# Patient Record
Sex: Female | Born: 1935 | Race: White | Hispanic: No | Smoking: Never smoker
Health system: Southern US, Community
[De-identification: ages and names within clinical notes are randomized; demographics above are authoritative.]

## PROBLEM LIST (undated history)

## (undated) DIAGNOSIS — I10 Essential (primary) hypertension: Secondary | ICD-10-CM

## (undated) DIAGNOSIS — R42 Dizziness and giddiness: Secondary | ICD-10-CM

## (undated) HISTORY — PX: ABDOMINAL HYSTERECTOMY: SHX81

## (undated) HISTORY — PX: TONSILLECTOMY: SUR1361

---

## 1998-03-25 ENCOUNTER — Other Ambulatory Visit: Admission: RE | Admit: 1998-03-25 | Discharge: 1998-03-25 | Payer: Self-pay | Admitting: Internal Medicine

## 2001-04-21 ENCOUNTER — Ambulatory Visit (HOSPITAL_COMMUNITY): Admission: RE | Admit: 2001-04-21 | Discharge: 2001-04-21 | Payer: Self-pay | Admitting: Gastroenterology

## 2003-05-30 ENCOUNTER — Emergency Department (HOSPITAL_COMMUNITY): Admission: EM | Admit: 2003-05-30 | Discharge: 2003-05-30 | Payer: Self-pay | Admitting: *Deleted

## 2003-06-03 ENCOUNTER — Ambulatory Visit (HOSPITAL_COMMUNITY): Admission: RE | Admit: 2003-06-03 | Discharge: 2003-06-03 | Payer: Self-pay | Admitting: General Surgery

## 2003-08-17 ENCOUNTER — Inpatient Hospital Stay (HOSPITAL_COMMUNITY): Admission: EM | Admit: 2003-08-17 | Discharge: 2003-08-21 | Payer: Self-pay | Admitting: Emergency Medicine

## 2011-10-10 DIAGNOSIS — Z803 Family history of malignant neoplasm of breast: Secondary | ICD-10-CM | POA: Diagnosis not present

## 2011-10-10 DIAGNOSIS — Z1231 Encounter for screening mammogram for malignant neoplasm of breast: Secondary | ICD-10-CM | POA: Diagnosis not present

## 2011-11-15 DIAGNOSIS — M25579 Pain in unspecified ankle and joints of unspecified foot: Secondary | ICD-10-CM | POA: Diagnosis not present

## 2011-11-15 DIAGNOSIS — M79609 Pain in unspecified limb: Secondary | ICD-10-CM | POA: Diagnosis not present

## 2011-11-15 DIAGNOSIS — M948X9 Other specified disorders of cartilage, unspecified sites: Secondary | ICD-10-CM | POA: Diagnosis not present

## 2011-11-15 DIAGNOSIS — M2010 Hallux valgus (acquired), unspecified foot: Secondary | ICD-10-CM | POA: Diagnosis not present

## 2011-11-23 DIAGNOSIS — M25579 Pain in unspecified ankle and joints of unspecified foot: Secondary | ICD-10-CM | POA: Diagnosis not present

## 2011-11-23 DIAGNOSIS — M65979 Unspecified synovitis and tenosynovitis, unspecified ankle and foot: Secondary | ICD-10-CM | POA: Diagnosis not present

## 2011-11-23 DIAGNOSIS — M659 Synovitis and tenosynovitis, unspecified: Secondary | ICD-10-CM | POA: Diagnosis not present

## 2011-11-28 DIAGNOSIS — N183 Chronic kidney disease, stage 3 unspecified: Secondary | ICD-10-CM | POA: Diagnosis not present

## 2011-11-28 DIAGNOSIS — E785 Hyperlipidemia, unspecified: Secondary | ICD-10-CM | POA: Diagnosis not present

## 2011-11-28 DIAGNOSIS — I10 Essential (primary) hypertension: Secondary | ICD-10-CM | POA: Diagnosis not present

## 2011-11-28 DIAGNOSIS — I872 Venous insufficiency (chronic) (peripheral): Secondary | ICD-10-CM | POA: Diagnosis not present

## 2011-11-28 DIAGNOSIS — M316 Other giant cell arteritis: Secondary | ICD-10-CM | POA: Diagnosis not present

## 2011-11-28 DIAGNOSIS — E168 Other specified disorders of pancreatic internal secretion: Secondary | ICD-10-CM | POA: Diagnosis not present

## 2011-12-05 DIAGNOSIS — I10 Essential (primary) hypertension: Secondary | ICD-10-CM | POA: Diagnosis not present

## 2011-12-05 DIAGNOSIS — L03019 Cellulitis of unspecified finger: Secondary | ICD-10-CM | POA: Diagnosis not present

## 2011-12-05 DIAGNOSIS — L02519 Cutaneous abscess of unspecified hand: Secondary | ICD-10-CM | POA: Diagnosis not present

## 2011-12-06 DIAGNOSIS — I10 Essential (primary) hypertension: Secondary | ICD-10-CM | POA: Diagnosis not present

## 2011-12-06 DIAGNOSIS — E785 Hyperlipidemia, unspecified: Secondary | ICD-10-CM | POA: Diagnosis not present

## 2011-12-06 DIAGNOSIS — L03019 Cellulitis of unspecified finger: Secondary | ICD-10-CM | POA: Diagnosis not present

## 2011-12-06 DIAGNOSIS — L02519 Cutaneous abscess of unspecified hand: Secondary | ICD-10-CM | POA: Diagnosis not present

## 2011-12-06 DIAGNOSIS — M109 Gout, unspecified: Secondary | ICD-10-CM | POA: Diagnosis not present

## 2011-12-07 DIAGNOSIS — M2020 Hallux rigidus, unspecified foot: Secondary | ICD-10-CM | POA: Diagnosis not present

## 2011-12-07 DIAGNOSIS — M7150 Other bursitis, not elsewhere classified, unspecified site: Secondary | ICD-10-CM | POA: Diagnosis not present

## 2011-12-11 DIAGNOSIS — M109 Gout, unspecified: Secondary | ICD-10-CM | POA: Diagnosis not present

## 2012-01-18 DIAGNOSIS — M7150 Other bursitis, not elsewhere classified, unspecified site: Secondary | ICD-10-CM | POA: Diagnosis not present

## 2012-01-18 DIAGNOSIS — M2020 Hallux rigidus, unspecified foot: Secondary | ICD-10-CM | POA: Diagnosis not present

## 2012-05-26 DIAGNOSIS — E168 Other specified disorders of pancreatic internal secretion: Secondary | ICD-10-CM | POA: Diagnosis not present

## 2012-05-26 DIAGNOSIS — M316 Other giant cell arteritis: Secondary | ICD-10-CM | POA: Diagnosis not present

## 2012-05-26 DIAGNOSIS — M109 Gout, unspecified: Secondary | ICD-10-CM | POA: Diagnosis not present

## 2012-05-26 DIAGNOSIS — Z1331 Encounter for screening for depression: Secondary | ICD-10-CM | POA: Diagnosis not present

## 2012-06-02 DIAGNOSIS — Z23 Encounter for immunization: Secondary | ICD-10-CM | POA: Diagnosis not present

## 2012-09-16 DIAGNOSIS — H0230 Blepharochalasis unspecified eye, unspecified eyelid: Secondary | ICD-10-CM | POA: Diagnosis not present

## 2012-09-16 DIAGNOSIS — Z961 Presence of intraocular lens: Secondary | ICD-10-CM | POA: Diagnosis not present

## 2012-09-16 DIAGNOSIS — H35369 Drusen (degenerative) of macula, unspecified eye: Secondary | ICD-10-CM | POA: Diagnosis not present

## 2012-11-14 DIAGNOSIS — E168 Other specified disorders of pancreatic internal secretion: Secondary | ICD-10-CM | POA: Diagnosis not present

## 2012-11-14 DIAGNOSIS — J0100 Acute maxillary sinusitis, unspecified: Secondary | ICD-10-CM | POA: Diagnosis not present

## 2012-11-14 DIAGNOSIS — H6690 Otitis media, unspecified, unspecified ear: Secondary | ICD-10-CM | POA: Diagnosis not present

## 2012-11-14 DIAGNOSIS — R059 Cough, unspecified: Secondary | ICD-10-CM | POA: Diagnosis not present

## 2012-11-14 DIAGNOSIS — R05 Cough: Secondary | ICD-10-CM | POA: Diagnosis not present

## 2012-11-14 DIAGNOSIS — Z6832 Body mass index (BMI) 32.0-32.9, adult: Secondary | ICD-10-CM | POA: Diagnosis not present

## 2012-11-14 DIAGNOSIS — I10 Essential (primary) hypertension: Secondary | ICD-10-CM | POA: Diagnosis not present

## 2012-11-26 DIAGNOSIS — M899 Disorder of bone, unspecified: Secondary | ICD-10-CM | POA: Diagnosis not present

## 2012-11-26 DIAGNOSIS — E785 Hyperlipidemia, unspecified: Secondary | ICD-10-CM | POA: Diagnosis not present

## 2012-11-26 DIAGNOSIS — I10 Essential (primary) hypertension: Secondary | ICD-10-CM | POA: Diagnosis not present

## 2012-11-26 DIAGNOSIS — R82998 Other abnormal findings in urine: Secondary | ICD-10-CM | POA: Diagnosis not present

## 2012-11-26 DIAGNOSIS — M949 Disorder of cartilage, unspecified: Secondary | ICD-10-CM | POA: Diagnosis not present

## 2012-11-26 DIAGNOSIS — E168 Other specified disorders of pancreatic internal secretion: Secondary | ICD-10-CM | POA: Diagnosis not present

## 2012-11-26 DIAGNOSIS — M109 Gout, unspecified: Secondary | ICD-10-CM | POA: Diagnosis not present

## 2012-11-26 DIAGNOSIS — N183 Chronic kidney disease, stage 3 unspecified: Secondary | ICD-10-CM | POA: Diagnosis not present

## 2012-11-28 DIAGNOSIS — M316 Other giant cell arteritis: Secondary | ICD-10-CM | POA: Diagnosis not present

## 2012-12-02 DIAGNOSIS — M1990 Unspecified osteoarthritis, unspecified site: Secondary | ICD-10-CM | POA: Diagnosis not present

## 2012-12-02 DIAGNOSIS — M109 Gout, unspecified: Secondary | ICD-10-CM | POA: Diagnosis not present

## 2012-12-02 DIAGNOSIS — Z0000 Encounter for general adult medical examination without abnormal findings: Secondary | ICD-10-CM | POA: Diagnosis not present

## 2012-12-02 DIAGNOSIS — E785 Hyperlipidemia, unspecified: Secondary | ICD-10-CM | POA: Diagnosis not present

## 2012-12-02 DIAGNOSIS — M899 Disorder of bone, unspecified: Secondary | ICD-10-CM | POA: Diagnosis not present

## 2012-12-02 DIAGNOSIS — E168 Other specified disorders of pancreatic internal secretion: Secondary | ICD-10-CM | POA: Diagnosis not present

## 2012-12-02 DIAGNOSIS — I872 Venous insufficiency (chronic) (peripheral): Secondary | ICD-10-CM | POA: Diagnosis not present

## 2012-12-02 DIAGNOSIS — N183 Chronic kidney disease, stage 3 unspecified: Secondary | ICD-10-CM | POA: Diagnosis not present

## 2012-12-02 DIAGNOSIS — I10 Essential (primary) hypertension: Secondary | ICD-10-CM | POA: Diagnosis not present

## 2012-12-02 DIAGNOSIS — M949 Disorder of cartilage, unspecified: Secondary | ICD-10-CM | POA: Diagnosis not present

## 2012-12-09 DIAGNOSIS — Z1231 Encounter for screening mammogram for malignant neoplasm of breast: Secondary | ICD-10-CM | POA: Diagnosis not present

## 2012-12-12 DIAGNOSIS — Z1212 Encounter for screening for malignant neoplasm of rectum: Secondary | ICD-10-CM | POA: Diagnosis not present

## 2012-12-15 DIAGNOSIS — N63 Unspecified lump in unspecified breast: Secondary | ICD-10-CM | POA: Diagnosis not present

## 2012-12-22 DIAGNOSIS — N6009 Solitary cyst of unspecified breast: Secondary | ICD-10-CM | POA: Diagnosis not present

## 2013-04-02 DIAGNOSIS — Z23 Encounter for immunization: Secondary | ICD-10-CM | POA: Diagnosis not present

## 2013-06-05 DIAGNOSIS — I10 Essential (primary) hypertension: Secondary | ICD-10-CM | POA: Diagnosis not present

## 2013-06-05 DIAGNOSIS — E168 Other specified disorders of pancreatic internal secretion: Secondary | ICD-10-CM | POA: Diagnosis not present

## 2013-06-05 DIAGNOSIS — Z6832 Body mass index (BMI) 32.0-32.9, adult: Secondary | ICD-10-CM | POA: Diagnosis not present

## 2013-06-05 DIAGNOSIS — M316 Other giant cell arteritis: Secondary | ICD-10-CM | POA: Diagnosis not present

## 2013-06-05 DIAGNOSIS — N183 Chronic kidney disease, stage 3 unspecified: Secondary | ICD-10-CM | POA: Diagnosis not present

## 2013-06-05 DIAGNOSIS — E785 Hyperlipidemia, unspecified: Secondary | ICD-10-CM | POA: Diagnosis not present

## 2013-06-05 DIAGNOSIS — I872 Venous insufficiency (chronic) (peripheral): Secondary | ICD-10-CM | POA: Diagnosis not present

## 2013-06-05 DIAGNOSIS — M1990 Unspecified osteoarthritis, unspecified site: Secondary | ICD-10-CM | POA: Diagnosis not present

## 2013-06-23 DIAGNOSIS — Z09 Encounter for follow-up examination after completed treatment for conditions other than malignant neoplasm: Secondary | ICD-10-CM | POA: Diagnosis not present

## 2013-06-23 DIAGNOSIS — Z803 Family history of malignant neoplasm of breast: Secondary | ICD-10-CM | POA: Diagnosis not present

## 2013-09-21 DIAGNOSIS — H353190 Nonexudative age-related macular degeneration, unspecified eye, stage unspecified: Secondary | ICD-10-CM | POA: Diagnosis not present

## 2013-09-21 DIAGNOSIS — Z961 Presence of intraocular lens: Secondary | ICD-10-CM | POA: Diagnosis not present

## 2013-12-11 DIAGNOSIS — M109 Gout, unspecified: Secondary | ICD-10-CM | POA: Diagnosis not present

## 2013-12-11 DIAGNOSIS — E168 Other specified disorders of pancreatic internal secretion: Secondary | ICD-10-CM | POA: Diagnosis not present

## 2013-12-11 DIAGNOSIS — M899 Disorder of bone, unspecified: Secondary | ICD-10-CM | POA: Diagnosis not present

## 2013-12-11 DIAGNOSIS — E785 Hyperlipidemia, unspecified: Secondary | ICD-10-CM | POA: Diagnosis not present

## 2013-12-11 DIAGNOSIS — N183 Chronic kidney disease, stage 3 unspecified: Secondary | ICD-10-CM | POA: Diagnosis not present

## 2013-12-11 DIAGNOSIS — I10 Essential (primary) hypertension: Secondary | ICD-10-CM | POA: Diagnosis not present

## 2013-12-11 DIAGNOSIS — R82998 Other abnormal findings in urine: Secondary | ICD-10-CM | POA: Diagnosis not present

## 2013-12-11 DIAGNOSIS — M1990 Unspecified osteoarthritis, unspecified site: Secondary | ICD-10-CM | POA: Diagnosis not present

## 2013-12-18 DIAGNOSIS — Z23 Encounter for immunization: Secondary | ICD-10-CM | POA: Diagnosis not present

## 2013-12-18 DIAGNOSIS — I10 Essential (primary) hypertension: Secondary | ICD-10-CM | POA: Diagnosis not present

## 2013-12-18 DIAGNOSIS — Z0000 Encounter for general adult medical examination without abnormal findings: Secondary | ICD-10-CM | POA: Diagnosis not present

## 2013-12-18 DIAGNOSIS — M949 Disorder of cartilage, unspecified: Secondary | ICD-10-CM | POA: Diagnosis not present

## 2013-12-18 DIAGNOSIS — N183 Chronic kidney disease, stage 3 unspecified: Secondary | ICD-10-CM | POA: Diagnosis not present

## 2013-12-18 DIAGNOSIS — E168 Other specified disorders of pancreatic internal secretion: Secondary | ICD-10-CM | POA: Diagnosis not present

## 2013-12-18 DIAGNOSIS — Z1331 Encounter for screening for depression: Secondary | ICD-10-CM | POA: Diagnosis not present

## 2013-12-18 DIAGNOSIS — M899 Disorder of bone, unspecified: Secondary | ICD-10-CM | POA: Diagnosis not present

## 2013-12-18 DIAGNOSIS — D649 Anemia, unspecified: Secondary | ICD-10-CM | POA: Diagnosis not present

## 2013-12-18 DIAGNOSIS — I872 Venous insufficiency (chronic) (peripheral): Secondary | ICD-10-CM | POA: Diagnosis not present

## 2013-12-18 DIAGNOSIS — E785 Hyperlipidemia, unspecified: Secondary | ICD-10-CM | POA: Diagnosis not present

## 2013-12-21 DIAGNOSIS — Z1212 Encounter for screening for malignant neoplasm of rectum: Secondary | ICD-10-CM | POA: Diagnosis not present

## 2013-12-28 DIAGNOSIS — D485 Neoplasm of uncertain behavior of skin: Secondary | ICD-10-CM | POA: Diagnosis not present

## 2013-12-28 DIAGNOSIS — L819 Disorder of pigmentation, unspecified: Secondary | ICD-10-CM | POA: Diagnosis not present

## 2013-12-28 DIAGNOSIS — D235 Other benign neoplasm of skin of trunk: Secondary | ICD-10-CM | POA: Diagnosis not present

## 2013-12-28 DIAGNOSIS — L821 Other seborrheic keratosis: Secondary | ICD-10-CM | POA: Diagnosis not present

## 2014-02-19 DIAGNOSIS — D485 Neoplasm of uncertain behavior of skin: Secondary | ICD-10-CM | POA: Diagnosis not present

## 2014-04-28 DIAGNOSIS — Z23 Encounter for immunization: Secondary | ICD-10-CM | POA: Diagnosis not present

## 2014-06-18 DIAGNOSIS — Z6833 Body mass index (BMI) 33.0-33.9, adult: Secondary | ICD-10-CM | POA: Diagnosis not present

## 2014-06-18 DIAGNOSIS — N183 Chronic kidney disease, stage 3 (moderate): Secondary | ICD-10-CM | POA: Diagnosis not present

## 2014-06-18 DIAGNOSIS — D649 Anemia, unspecified: Secondary | ICD-10-CM | POA: Diagnosis not present

## 2014-06-18 DIAGNOSIS — I10 Essential (primary) hypertension: Secondary | ICD-10-CM | POA: Diagnosis not present

## 2014-06-18 DIAGNOSIS — E099 Drug or chemical induced diabetes mellitus without complications: Secondary | ICD-10-CM | POA: Diagnosis not present

## 2014-06-18 DIAGNOSIS — M316 Other giant cell arteritis: Secondary | ICD-10-CM | POA: Diagnosis not present

## 2014-06-22 DIAGNOSIS — I10 Essential (primary) hypertension: Secondary | ICD-10-CM | POA: Diagnosis not present

## 2014-08-31 DIAGNOSIS — Z6832 Body mass index (BMI) 32.0-32.9, adult: Secondary | ICD-10-CM | POA: Diagnosis not present

## 2014-08-31 DIAGNOSIS — R05 Cough: Secondary | ICD-10-CM | POA: Diagnosis not present

## 2014-08-31 DIAGNOSIS — J209 Acute bronchitis, unspecified: Secondary | ICD-10-CM | POA: Diagnosis not present

## 2014-09-14 DIAGNOSIS — J209 Acute bronchitis, unspecified: Secondary | ICD-10-CM | POA: Diagnosis not present

## 2014-09-29 DIAGNOSIS — H3531 Nonexudative age-related macular degeneration: Secondary | ICD-10-CM | POA: Diagnosis not present

## 2014-10-06 DIAGNOSIS — Z6832 Body mass index (BMI) 32.0-32.9, adult: Secondary | ICD-10-CM | POA: Diagnosis not present

## 2014-10-06 DIAGNOSIS — J209 Acute bronchitis, unspecified: Secondary | ICD-10-CM | POA: Diagnosis not present

## 2014-10-11 DIAGNOSIS — M109 Gout, unspecified: Secondary | ICD-10-CM | POA: Diagnosis not present

## 2014-10-11 DIAGNOSIS — Z6832 Body mass index (BMI) 32.0-32.9, adult: Secondary | ICD-10-CM | POA: Diagnosis not present

## 2014-10-11 DIAGNOSIS — J209 Acute bronchitis, unspecified: Secondary | ICD-10-CM | POA: Diagnosis not present

## 2014-10-11 DIAGNOSIS — D649 Anemia, unspecified: Secondary | ICD-10-CM | POA: Diagnosis not present

## 2014-10-11 DIAGNOSIS — R05 Cough: Secondary | ICD-10-CM | POA: Diagnosis not present

## 2014-10-29 DIAGNOSIS — Z6832 Body mass index (BMI) 32.0-32.9, adult: Secondary | ICD-10-CM | POA: Diagnosis not present

## 2014-10-29 DIAGNOSIS — J029 Acute pharyngitis, unspecified: Secondary | ICD-10-CM | POA: Diagnosis not present

## 2014-10-29 DIAGNOSIS — J45909 Unspecified asthma, uncomplicated: Secondary | ICD-10-CM | POA: Diagnosis not present

## 2014-10-29 DIAGNOSIS — J309 Allergic rhinitis, unspecified: Secondary | ICD-10-CM | POA: Diagnosis not present

## 2014-12-21 DIAGNOSIS — M109 Gout, unspecified: Secondary | ICD-10-CM | POA: Diagnosis not present

## 2014-12-21 DIAGNOSIS — N390 Urinary tract infection, site not specified: Secondary | ICD-10-CM | POA: Diagnosis not present

## 2014-12-21 DIAGNOSIS — N183 Chronic kidney disease, stage 3 (moderate): Secondary | ICD-10-CM | POA: Diagnosis not present

## 2014-12-21 DIAGNOSIS — E785 Hyperlipidemia, unspecified: Secondary | ICD-10-CM | POA: Diagnosis not present

## 2014-12-21 DIAGNOSIS — M859 Disorder of bone density and structure, unspecified: Secondary | ICD-10-CM | POA: Diagnosis not present

## 2014-12-21 DIAGNOSIS — R8299 Other abnormal findings in urine: Secondary | ICD-10-CM | POA: Diagnosis not present

## 2014-12-21 DIAGNOSIS — E099 Drug or chemical induced diabetes mellitus without complications: Secondary | ICD-10-CM | POA: Diagnosis not present

## 2014-12-28 DIAGNOSIS — Z6833 Body mass index (BMI) 33.0-33.9, adult: Secondary | ICD-10-CM | POA: Diagnosis not present

## 2014-12-28 DIAGNOSIS — Z1389 Encounter for screening for other disorder: Secondary | ICD-10-CM | POA: Diagnosis not present

## 2014-12-28 DIAGNOSIS — I872 Venous insufficiency (chronic) (peripheral): Secondary | ICD-10-CM | POA: Diagnosis not present

## 2014-12-28 DIAGNOSIS — N184 Chronic kidney disease, stage 4 (severe): Secondary | ICD-10-CM | POA: Diagnosis not present

## 2014-12-28 DIAGNOSIS — M1990 Unspecified osteoarthritis, unspecified site: Secondary | ICD-10-CM | POA: Diagnosis not present

## 2014-12-28 DIAGNOSIS — J309 Allergic rhinitis, unspecified: Secondary | ICD-10-CM | POA: Diagnosis not present

## 2014-12-28 DIAGNOSIS — M8580 Other specified disorders of bone density and structure, unspecified site: Secondary | ICD-10-CM | POA: Diagnosis not present

## 2014-12-28 DIAGNOSIS — D649 Anemia, unspecified: Secondary | ICD-10-CM | POA: Diagnosis not present

## 2014-12-28 DIAGNOSIS — I129 Hypertensive chronic kidney disease with stage 1 through stage 4 chronic kidney disease, or unspecified chronic kidney disease: Secondary | ICD-10-CM | POA: Diagnosis not present

## 2014-12-28 DIAGNOSIS — E559 Vitamin D deficiency, unspecified: Secondary | ICD-10-CM | POA: Diagnosis not present

## 2014-12-28 DIAGNOSIS — M109 Gout, unspecified: Secondary | ICD-10-CM | POA: Diagnosis not present

## 2014-12-28 DIAGNOSIS — Z0000 Encounter for general adult medical examination without abnormal findings: Secondary | ICD-10-CM | POA: Diagnosis not present

## 2014-12-30 DIAGNOSIS — Z1212 Encounter for screening for malignant neoplasm of rectum: Secondary | ICD-10-CM | POA: Diagnosis not present

## 2015-02-09 DIAGNOSIS — Z803 Family history of malignant neoplasm of breast: Secondary | ICD-10-CM | POA: Diagnosis not present

## 2015-02-09 DIAGNOSIS — Z1231 Encounter for screening mammogram for malignant neoplasm of breast: Secondary | ICD-10-CM | POA: Diagnosis not present

## 2015-03-25 DIAGNOSIS — L820 Inflamed seborrheic keratosis: Secondary | ICD-10-CM | POA: Diagnosis not present

## 2015-05-03 DIAGNOSIS — Z6833 Body mass index (BMI) 33.0-33.9, adult: Secondary | ICD-10-CM | POA: Diagnosis not present

## 2015-05-03 DIAGNOSIS — D649 Anemia, unspecified: Secondary | ICD-10-CM | POA: Diagnosis not present

## 2015-05-03 DIAGNOSIS — N184 Chronic kidney disease, stage 4 (severe): Secondary | ICD-10-CM | POA: Diagnosis not present

## 2015-05-03 DIAGNOSIS — Z23 Encounter for immunization: Secondary | ICD-10-CM | POA: Diagnosis not present

## 2015-05-03 DIAGNOSIS — M316 Other giant cell arteritis: Secondary | ICD-10-CM | POA: Diagnosis not present

## 2015-05-03 DIAGNOSIS — M109 Gout, unspecified: Secondary | ICD-10-CM | POA: Diagnosis not present

## 2015-05-03 DIAGNOSIS — E785 Hyperlipidemia, unspecified: Secondary | ICD-10-CM | POA: Diagnosis not present

## 2015-05-03 DIAGNOSIS — E099 Drug or chemical induced diabetes mellitus without complications: Secondary | ICD-10-CM | POA: Diagnosis not present

## 2015-05-03 DIAGNOSIS — I129 Hypertensive chronic kidney disease with stage 1 through stage 4 chronic kidney disease, or unspecified chronic kidney disease: Secondary | ICD-10-CM | POA: Diagnosis not present

## 2015-05-03 DIAGNOSIS — I10 Essential (primary) hypertension: Secondary | ICD-10-CM | POA: Diagnosis not present

## 2015-06-09 DIAGNOSIS — H1131 Conjunctival hemorrhage, right eye: Secondary | ICD-10-CM | POA: Diagnosis not present

## 2015-08-01 DIAGNOSIS — R69 Illness, unspecified: Secondary | ICD-10-CM | POA: Diagnosis not present

## 2015-09-01 DIAGNOSIS — Z1389 Encounter for screening for other disorder: Secondary | ICD-10-CM | POA: Diagnosis not present

## 2015-09-01 DIAGNOSIS — D6489 Other specified anemias: Secondary | ICD-10-CM | POA: Diagnosis not present

## 2015-09-01 DIAGNOSIS — Z6833 Body mass index (BMI) 33.0-33.9, adult: Secondary | ICD-10-CM | POA: Diagnosis not present

## 2015-09-01 DIAGNOSIS — M109 Gout, unspecified: Secondary | ICD-10-CM | POA: Diagnosis not present

## 2015-09-01 DIAGNOSIS — N184 Chronic kidney disease, stage 4 (severe): Secondary | ICD-10-CM | POA: Diagnosis not present

## 2015-09-01 DIAGNOSIS — E099 Drug or chemical induced diabetes mellitus without complications: Secondary | ICD-10-CM | POA: Diagnosis not present

## 2015-09-01 DIAGNOSIS — I129 Hypertensive chronic kidney disease with stage 1 through stage 4 chronic kidney disease, or unspecified chronic kidney disease: Secondary | ICD-10-CM | POA: Diagnosis not present

## 2015-09-01 DIAGNOSIS — M316 Other giant cell arteritis: Secondary | ICD-10-CM | POA: Diagnosis not present

## 2015-10-12 DIAGNOSIS — I10 Essential (primary) hypertension: Secondary | ICD-10-CM | POA: Diagnosis not present

## 2015-10-13 DIAGNOSIS — E874 Mixed disorder of acid-base balance: Secondary | ICD-10-CM | POA: Diagnosis not present

## 2015-10-28 DIAGNOSIS — E875 Hyperkalemia: Secondary | ICD-10-CM | POA: Diagnosis not present

## 2015-12-29 DIAGNOSIS — M109 Gout, unspecified: Secondary | ICD-10-CM | POA: Diagnosis not present

## 2015-12-29 DIAGNOSIS — I10 Essential (primary) hypertension: Secondary | ICD-10-CM | POA: Diagnosis not present

## 2015-12-29 DIAGNOSIS — E559 Vitamin D deficiency, unspecified: Secondary | ICD-10-CM | POA: Diagnosis not present

## 2015-12-29 DIAGNOSIS — E099 Drug or chemical induced diabetes mellitus without complications: Secondary | ICD-10-CM | POA: Diagnosis not present

## 2015-12-29 DIAGNOSIS — E784 Other hyperlipidemia: Secondary | ICD-10-CM | POA: Diagnosis not present

## 2015-12-29 DIAGNOSIS — N390 Urinary tract infection, site not specified: Secondary | ICD-10-CM | POA: Diagnosis not present

## 2015-12-29 DIAGNOSIS — R8290 Unspecified abnormal findings in urine: Secondary | ICD-10-CM | POA: Diagnosis not present

## 2016-01-05 DIAGNOSIS — E559 Vitamin D deficiency, unspecified: Secondary | ICD-10-CM | POA: Diagnosis not present

## 2016-01-05 DIAGNOSIS — I10 Essential (primary) hypertension: Secondary | ICD-10-CM | POA: Diagnosis not present

## 2016-01-05 DIAGNOSIS — E099 Drug or chemical induced diabetes mellitus without complications: Secondary | ICD-10-CM | POA: Diagnosis not present

## 2016-01-05 DIAGNOSIS — E784 Other hyperlipidemia: Secondary | ICD-10-CM | POA: Diagnosis not present

## 2016-01-05 DIAGNOSIS — I129 Hypertensive chronic kidney disease with stage 1 through stage 4 chronic kidney disease, or unspecified chronic kidney disease: Secondary | ICD-10-CM | POA: Diagnosis not present

## 2016-01-05 DIAGNOSIS — M316 Other giant cell arteritis: Secondary | ICD-10-CM | POA: Diagnosis not present

## 2016-01-05 DIAGNOSIS — Z0000 Encounter for general adult medical examination without abnormal findings: Secondary | ICD-10-CM | POA: Diagnosis not present

## 2016-01-05 DIAGNOSIS — M859 Disorder of bone density and structure, unspecified: Secondary | ICD-10-CM | POA: Diagnosis not present

## 2016-01-05 DIAGNOSIS — N184 Chronic kidney disease, stage 4 (severe): Secondary | ICD-10-CM | POA: Diagnosis not present

## 2016-01-05 DIAGNOSIS — D6489 Other specified anemias: Secondary | ICD-10-CM | POA: Diagnosis not present

## 2016-02-09 DIAGNOSIS — M109 Gout, unspecified: Secondary | ICD-10-CM | POA: Diagnosis not present

## 2016-02-09 DIAGNOSIS — D649 Anemia, unspecified: Secondary | ICD-10-CM | POA: Diagnosis not present

## 2016-02-24 DIAGNOSIS — Z803 Family history of malignant neoplasm of breast: Secondary | ICD-10-CM | POA: Diagnosis not present

## 2016-02-24 DIAGNOSIS — Z1231 Encounter for screening mammogram for malignant neoplasm of breast: Secondary | ICD-10-CM | POA: Diagnosis not present

## 2016-05-15 DIAGNOSIS — E099 Drug or chemical induced diabetes mellitus without complications: Secondary | ICD-10-CM | POA: Diagnosis not present

## 2016-05-15 DIAGNOSIS — Z23 Encounter for immunization: Secondary | ICD-10-CM | POA: Diagnosis not present

## 2016-05-15 DIAGNOSIS — D649 Anemia, unspecified: Secondary | ICD-10-CM | POA: Diagnosis not present

## 2016-05-15 DIAGNOSIS — M109 Gout, unspecified: Secondary | ICD-10-CM | POA: Diagnosis not present

## 2016-05-15 DIAGNOSIS — I129 Hypertensive chronic kidney disease with stage 1 through stage 4 chronic kidney disease, or unspecified chronic kidney disease: Secondary | ICD-10-CM | POA: Diagnosis not present

## 2016-05-15 DIAGNOSIS — Z6832 Body mass index (BMI) 32.0-32.9, adult: Secondary | ICD-10-CM | POA: Diagnosis not present

## 2016-05-15 DIAGNOSIS — D631 Anemia in chronic kidney disease: Secondary | ICD-10-CM | POA: Diagnosis not present

## 2016-05-15 DIAGNOSIS — M316 Other giant cell arteritis: Secondary | ICD-10-CM | POA: Diagnosis not present

## 2016-05-15 DIAGNOSIS — N184 Chronic kidney disease, stage 4 (severe): Secondary | ICD-10-CM | POA: Diagnosis not present

## 2016-09-04 DIAGNOSIS — I129 Hypertensive chronic kidney disease with stage 1 through stage 4 chronic kidney disease, or unspecified chronic kidney disease: Secondary | ICD-10-CM | POA: Diagnosis not present

## 2016-09-04 DIAGNOSIS — Z6833 Body mass index (BMI) 33.0-33.9, adult: Secondary | ICD-10-CM | POA: Diagnosis not present

## 2016-09-04 DIAGNOSIS — M109 Gout, unspecified: Secondary | ICD-10-CM | POA: Diagnosis not present

## 2016-09-04 DIAGNOSIS — D649 Anemia, unspecified: Secondary | ICD-10-CM | POA: Diagnosis not present

## 2016-09-04 DIAGNOSIS — M316 Other giant cell arteritis: Secondary | ICD-10-CM | POA: Diagnosis not present

## 2016-09-04 DIAGNOSIS — R42 Dizziness and giddiness: Secondary | ICD-10-CM | POA: Diagnosis not present

## 2016-09-04 DIAGNOSIS — E099 Drug or chemical induced diabetes mellitus without complications: Secondary | ICD-10-CM | POA: Diagnosis not present

## 2017-01-01 DIAGNOSIS — R69 Illness, unspecified: Secondary | ICD-10-CM | POA: Diagnosis not present

## 2017-01-08 DIAGNOSIS — N390 Urinary tract infection, site not specified: Secondary | ICD-10-CM | POA: Diagnosis not present

## 2017-01-08 DIAGNOSIS — M859 Disorder of bone density and structure, unspecified: Secondary | ICD-10-CM | POA: Diagnosis not present

## 2017-01-08 DIAGNOSIS — E784 Other hyperlipidemia: Secondary | ICD-10-CM | POA: Diagnosis not present

## 2017-01-08 DIAGNOSIS — E099 Drug or chemical induced diabetes mellitus without complications: Secondary | ICD-10-CM | POA: Diagnosis not present

## 2017-01-08 DIAGNOSIS — M109 Gout, unspecified: Secondary | ICD-10-CM | POA: Diagnosis not present

## 2017-01-08 DIAGNOSIS — I10 Essential (primary) hypertension: Secondary | ICD-10-CM | POA: Diagnosis not present

## 2017-01-08 DIAGNOSIS — R8299 Other abnormal findings in urine: Secondary | ICD-10-CM | POA: Diagnosis not present

## 2017-01-15 DIAGNOSIS — Z0000 Encounter for general adult medical examination without abnormal findings: Secondary | ICD-10-CM | POA: Diagnosis not present

## 2017-01-15 DIAGNOSIS — D6489 Other specified anemias: Secondary | ICD-10-CM | POA: Diagnosis not present

## 2017-01-15 DIAGNOSIS — J3089 Other allergic rhinitis: Secondary | ICD-10-CM | POA: Diagnosis not present

## 2017-01-15 DIAGNOSIS — N184 Chronic kidney disease, stage 4 (severe): Secondary | ICD-10-CM | POA: Diagnosis not present

## 2017-01-15 DIAGNOSIS — D692 Other nonthrombocytopenic purpura: Secondary | ICD-10-CM | POA: Diagnosis not present

## 2017-01-15 DIAGNOSIS — E559 Vitamin D deficiency, unspecified: Secondary | ICD-10-CM | POA: Diagnosis not present

## 2017-01-15 DIAGNOSIS — E099 Drug or chemical induced diabetes mellitus without complications: Secondary | ICD-10-CM | POA: Diagnosis not present

## 2017-01-15 DIAGNOSIS — I129 Hypertensive chronic kidney disease with stage 1 through stage 4 chronic kidney disease, or unspecified chronic kidney disease: Secondary | ICD-10-CM | POA: Diagnosis not present

## 2017-01-15 DIAGNOSIS — R42 Dizziness and giddiness: Secondary | ICD-10-CM | POA: Diagnosis not present

## 2017-01-15 DIAGNOSIS — M316 Other giant cell arteritis: Secondary | ICD-10-CM | POA: Diagnosis not present

## 2017-01-17 DIAGNOSIS — Z1212 Encounter for screening for malignant neoplasm of rectum: Secondary | ICD-10-CM | POA: Diagnosis not present

## 2017-02-25 DIAGNOSIS — Z1231 Encounter for screening mammogram for malignant neoplasm of breast: Secondary | ICD-10-CM | POA: Diagnosis not present

## 2017-02-25 DIAGNOSIS — Z803 Family history of malignant neoplasm of breast: Secondary | ICD-10-CM | POA: Diagnosis not present

## 2017-05-28 DIAGNOSIS — R69 Illness, unspecified: Secondary | ICD-10-CM | POA: Diagnosis not present

## 2017-07-22 DIAGNOSIS — Z6831 Body mass index (BMI) 31.0-31.9, adult: Secondary | ICD-10-CM | POA: Diagnosis not present

## 2017-07-22 DIAGNOSIS — M4802 Spinal stenosis, cervical region: Secondary | ICD-10-CM | POA: Diagnosis not present

## 2017-07-22 DIAGNOSIS — M5412 Radiculopathy, cervical region: Secondary | ICD-10-CM | POA: Diagnosis not present

## 2017-07-22 DIAGNOSIS — M25512 Pain in left shoulder: Secondary | ICD-10-CM | POA: Diagnosis not present

## 2017-08-02 DIAGNOSIS — Z6832 Body mass index (BMI) 32.0-32.9, adult: Secondary | ICD-10-CM | POA: Diagnosis not present

## 2017-08-02 DIAGNOSIS — I129 Hypertensive chronic kidney disease with stage 1 through stage 4 chronic kidney disease, or unspecified chronic kidney disease: Secondary | ICD-10-CM | POA: Diagnosis not present

## 2017-08-02 DIAGNOSIS — M25512 Pain in left shoulder: Secondary | ICD-10-CM | POA: Diagnosis not present

## 2017-08-02 DIAGNOSIS — N184 Chronic kidney disease, stage 4 (severe): Secondary | ICD-10-CM | POA: Diagnosis not present

## 2017-08-02 DIAGNOSIS — M316 Other giant cell arteritis: Secondary | ICD-10-CM | POA: Diagnosis not present

## 2017-08-02 DIAGNOSIS — D692 Other nonthrombocytopenic purpura: Secondary | ICD-10-CM | POA: Diagnosis not present

## 2017-10-22 ENCOUNTER — Emergency Department (HOSPITAL_BASED_OUTPATIENT_CLINIC_OR_DEPARTMENT_OTHER): Payer: MEDICARE

## 2017-10-22 ENCOUNTER — Other Ambulatory Visit: Payer: Self-pay

## 2017-10-22 ENCOUNTER — Inpatient Hospital Stay (HOSPITAL_BASED_OUTPATIENT_CLINIC_OR_DEPARTMENT_OTHER)
Admission: EM | Admit: 2017-10-22 | Discharge: 2017-10-23 | DRG: 812 | Disposition: A | Discharge status: Home Health | Payer: MEDICARE | Attending: Internal Medicine | Admitting: Internal Medicine

## 2017-10-22 ENCOUNTER — Encounter (HOSPITAL_BASED_OUTPATIENT_CLINIC_OR_DEPARTMENT_OTHER): Payer: Self-pay | Admitting: *Deleted

## 2017-10-22 DIAGNOSIS — Z7982 Long term (current) use of aspirin: Secondary | ICD-10-CM

## 2017-10-22 DIAGNOSIS — I10 Essential (primary) hypertension: Secondary | ICD-10-CM | POA: Diagnosis present

## 2017-10-22 DIAGNOSIS — D6489 Other specified anemias: Secondary | ICD-10-CM | POA: Diagnosis not present

## 2017-10-22 DIAGNOSIS — Z90710 Acquired absence of both cervix and uterus: Secondary | ICD-10-CM | POA: Diagnosis not present

## 2017-10-22 DIAGNOSIS — M109 Gout, unspecified: Secondary | ICD-10-CM

## 2017-10-22 DIAGNOSIS — D649 Anemia, unspecified: Secondary | ICD-10-CM | POA: Diagnosis not present

## 2017-10-22 DIAGNOSIS — R42 Dizziness and giddiness: Secondary | ICD-10-CM

## 2017-10-22 DIAGNOSIS — K922 Gastrointestinal hemorrhage, unspecified: Secondary | ICD-10-CM | POA: Diagnosis not present

## 2017-10-22 DIAGNOSIS — R531 Weakness: Secondary | ICD-10-CM

## 2017-10-22 DIAGNOSIS — J986 Disorders of diaphragm: Secondary | ICD-10-CM | POA: Diagnosis not present

## 2017-10-22 HISTORY — DX: Dizziness and giddiness: R42

## 2017-10-22 HISTORY — DX: Essential (primary) hypertension: I10

## 2017-10-22 LAB — RETICULOCYTES
RBC.: 3.33 MIL/uL — ABNORMAL LOW (ref 3.87–5.11)
RETIC CT PCT: 1.6 % (ref 0.4–3.1)
Retic Count, Absolute: 53.3 10*3/uL (ref 19.0–186.0)

## 2017-10-22 LAB — URINALYSIS, ROUTINE W REFLEX MICROSCOPIC
Bilirubin Urine: NEGATIVE
Glucose, UA: NEGATIVE mg/dL
Hgb urine dipstick: NEGATIVE
Ketones, ur: NEGATIVE mg/dL
Leukocytes, UA: NEGATIVE
Nitrite: NEGATIVE
Protein, ur: NEGATIVE mg/dL
Specific Gravity, Urine: 1.015 (ref 1.005–1.030)
pH: 5.5 (ref 5.0–8.0)

## 2017-10-22 LAB — ABO/RH: ABO/RH(D): O POS

## 2017-10-22 LAB — PREPARE RBC (CROSSMATCH)

## 2017-10-22 LAB — OCCULT BLOOD X 1 CARD TO LAB, STOOL: Fecal Occult Bld: POSITIVE — AB

## 2017-10-22 LAB — CBC
HCT: 27.5 % — ABNORMAL LOW (ref 36.0–46.0)
Hemoglobin: 8.7 g/dL — ABNORMAL LOW (ref 12.0–15.0)
MCH: 23.8 pg — ABNORMAL LOW (ref 26.0–34.0)
MCHC: 31.6 g/dL (ref 30.0–36.0)
MCV: 75.3 fL — ABNORMAL LOW (ref 78.0–100.0)
Platelets: 344 10*3/uL (ref 150–400)
RBC: 3.65 MIL/uL — ABNORMAL LOW (ref 3.87–5.11)
RDW: 16.3 % — ABNORMAL HIGH (ref 11.5–15.5)
WBC: 8.6 10*3/uL (ref 4.0–10.5)

## 2017-10-22 LAB — BASIC METABOLIC PANEL
Anion gap: 9 (ref 5–15)
BUN: 37 mg/dL — ABNORMAL HIGH (ref 6–20)
CO2: 23 mmol/L (ref 22–32)
Calcium: 8.6 mg/dL — ABNORMAL LOW (ref 8.9–10.3)
Chloride: 105 mmol/L (ref 101–111)
Creatinine, Ser: 1.28 mg/dL — ABNORMAL HIGH (ref 0.44–1.00)
GFR calc Af Amer: 41 mL/min — ABNORMAL LOW (ref >60)
GFR calc non Af Amer: 36 mL/min — ABNORMAL LOW (ref >60)
Glucose, Bld: 143 mg/dL — ABNORMAL HIGH (ref 65–99)
Potassium: 3.6 mmol/L (ref 3.5–5.1)
Sodium: 137 mmol/L (ref 135–145)

## 2017-10-22 LAB — IRON AND TIBC
IRON: 13 ug/dL — AB (ref 28–170)
SATURATION RATIOS: 3 % — AB (ref 10.4–31.8)
TIBC: 388 ug/dL (ref 250–450)
UIBC: 375 ug/dL

## 2017-10-22 LAB — CBG MONITORING, ED: Glucose-Capillary: 127 mg/dL — ABNORMAL HIGH (ref 65–99)

## 2017-10-22 LAB — TROPONIN I
Troponin I: <0.03 ng/mL (ref <0.03)
Troponin I: <0.03 ng/mL (ref <0.03)

## 2017-10-22 LAB — VITAMIN B12: Vitamin B-12: 372 pg/mL (ref 180–914)

## 2017-10-22 LAB — FOLATE: Folate: 14 ng/mL (ref >5.9)

## 2017-10-22 LAB — FERRITIN: FERRITIN: 7 ng/mL — AB (ref 11–307)

## 2017-10-22 MED ORDER — FERROUS SULFATE 325 (65 FE) MG PO TABS
325.0000 mg | ORAL_TABLET | Freq: Every day | ORAL | Status: DC
  Administered 2017-10-23: 325 mg via ORAL
  Filled 2017-10-22: qty 1

## 2017-10-22 MED ORDER — PANTOPRAZOLE SODIUM 40 MG PO TBEC: 40.0000 mg | DELAYED_RELEASE_TABLET | Freq: Every day | ORAL | Status: DC

## 2017-10-22 MED ORDER — SODIUM CHLORIDE 0.9 % IV SOLN
Freq: Once | INTRAVENOUS | Status: AC
  Administered 2017-10-22: via INTRAVENOUS

## 2017-10-22 MED ORDER — ONDANSETRON HCL 4 MG/2ML IJ SOLN: 4.0000 mg | Freq: Four times a day (QID) | INTRAMUSCULAR | Status: DC | PRN

## 2017-10-22 MED ORDER — ALLOPURINOL 300 MG PO TABS: 300.0000 mg | ORAL_TABLET | Freq: Every day | ORAL | Status: DC

## 2017-10-22 MED ORDER — ATENOLOL 50 MG PO TABS
50.0000 mg | ORAL_TABLET | Freq: Every day | ORAL | Status: DC
  Administered 2017-10-23: 50 mg via ORAL
  Filled 2017-10-22: qty 1

## 2017-10-22 MED ORDER — ONDANSETRON HCL 4 MG PO TABS: 4.0000 mg | ORAL_TABLET | Freq: Four times a day (QID) | ORAL | Status: DC | PRN

## 2017-10-22 MED ORDER — SODIUM CHLORIDE 0.9 % IV BOLUS
500.0000 mL | Freq: Once | INTRAVENOUS | Status: AC
  Administered 2017-10-22: 500 mL via INTRAVENOUS

## 2017-10-22 MED ORDER — TRIAMTERENE-HCTZ 37.5-25 MG PO TABS
1.0000 | ORAL_TABLET | Freq: Every day | ORAL | Status: DC
  Administered 2017-10-23: 1 via ORAL
  Filled 2017-10-22: qty 1

## 2017-10-22 MED ORDER — ACETAMINOPHEN 500 MG PO TABS: 500.0000 mg | ORAL_TABLET | Freq: Four times a day (QID) | ORAL | Status: DC | PRN

## 2017-10-22 MED ORDER — MECLIZINE HCL 25 MG PO TABS: 12.5000 mg | ORAL_TABLET | Freq: Three times a day (TID) | ORAL | Status: DC | PRN

## 2017-10-22 NOTE — H&P (Addendum)
History and Physical    Kaitlyn Garrison OTL:[id number] DOB: 07/17/1924 DOA: 10/22/2017  PCP: Shon Baton, MD   Patient coming from: Home   Chief Complaint: Generalized weakness  HPI: Kaitlyn Garrison is a 84 y.o. female with medical history significant for vertigo, HTN, Gout, was brought to the ED at [facility], with complaints of generalized weakness that started this morning.  Patient had worked from the living room to the kitchen, when she became weak all over, and asked her daughter to bring the chair to her.  Patient denies chest pain or shortness of breath at rest or with exertion, no focal weakness of extremities vision change, no swelling of extremities, vomiting, diarrhea and loose stools she has maintained good p.o. Intake.  She has occasional nausea with her vertigo.  Has a history of vertigo, which she takes as needed meclizine. 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  She experiences dizzy episodes only with certain positions of her head. She took meclizine, as prescribed by her PCP, and over the past 2 days she has been back to normal, without lightheadedness or dizziness. Patient denies abdominal pain, no black stools or bloody stools.  Takes very occasional NSAID to naproxen once in 1-2 months, last dose January, takes a daily baby aspirin.  Colonoscopy was at least a decade ago. Had  EGD in the 54s.  ED Course: Blood pressure systolic [id number] otherwise unremarkable vital. Hgb-8.7, normal platelets 344, UA clean, positive FOBT, Trop x1-neg, chest x-ray negative for acute abnormality, elevated left hemidiaphragm.  ED provider notes hemoglobin 3 months ago was 10.9.  Creatinine 1.3 today was 1.5, 3 months ago. Patient was given 576ml bolus in the ED. Hospitalist was called to admit for symptomatic anemia, patient was accepted by Dr. Nevada Crane and transferred to  [facility].  Review of Systems: As per HPI otherwise 10 point review of systems negative.   Past Medical History:  Diagnosis Date  . Hypertension   . Vertigo     Past Surgical History:  Procedure Laterality Date  . ABDOMINAL HYSTERECTOMY    . TONSILLECTOMY       reports that she has never smoked. She has never used smokeless tobacco. She reports that she does not drink alcohol or use drugs.  No Known Allergies  No family history on file.  Prior to Admission medications   Medication Sig Start Date End Date Taking? Authorizing Provider  acetaminophen (TYLENOL) 500 MG tablet Take 500 mg by mouth every 6 (six) hours as needed.   Yes [provider]  allopurinol (ZYLOPRIM) 300 MG tablet Take 300 mg by mouth daily.   Yes [provider]  aspirin 81 MG EC tablet Take 81 mg by mouth daily.   Yes [provider]  atenolol (TENORMIN) 50 MG tablet Take 50 mg by mouth daily.   Yes [provider]  colchicine 0.6 MG tablet Take 0.6 mg by mouth daily as needed (for gout flare).    Yes [provider]  cyclobenzaprine (FLEXERIL) 5 MG tablet Take 5 mg by mouth 3 (three) times daily as needed for muscle spasms.   Yes [provider]  meclizine (ANTIVERT) 12.5 MG tablet Take 12.5 mg by mouth 3 (three) times daily as needed for dizziness.   Yes [provider]  Menthol, Topical Analgesic, (BIOFREEZE ROLL-ON EX) Apply topically.   Yes [provider]  naproxen sodium (ALEVE) 220 MG tablet Take 220 mg by mouth 2 (two) times daily as needed.   Yes [provider]  Omega-3 Fatty Acids (FISH OIL) 1200 MG CAPS Take 1 capsule by mouth daily at 6 (six) AM.   Yes [provider]  triamterene-hydrochlorothiazide (MAXZIDE-25) 37.5-25 MG tablet Take 1 tablet by mouth daily.   Yes [provider]  ferrous sulfate 325 (65 FE) MG EC tablet Take 325 mg by mouth daily with breakfast.    [provider]    Physical  Exam: Vitals:   10/22/17 1600 10/22/17 1630 10/22/17 1700 10/22/17 1801  BP: (!) 154/60 (!) 142/54 (!) 155/57 (!) 180/70  Pulse: 72 66 66 67  Resp: 15 13 10 16   Temp:    98 F (36.7 C)  TempSrc:    Oral  SpO2: 100% 100% 100% 100%  Weight:      Height:        Constitutional: NAD, calm, comfortable Vitals:   10/22/17 1600 10/22/17 1630 10/22/17 1700 10/22/17 1801  BP: (!) 154/60 (!) 142/54 (!) 155/57 (!) 180/70  Pulse: 72 66 66 67  Resp: 15 13 10 16   Temp:    98 F (36.7 C)  TempSrc:    Oral  SpO2: 100% 100% 100% 100%  Weight:      Height:       Eyes: PERRL, lids and conjunctivae normal ENMT: Mucous membranes are moist. Posterior pharynx clear of any exudate or lesions.Normal dentition.  Neck: normal, supple, no masses, no thyromegaly Respiratory: clear to auscultation bilaterally, no wheezing, no crackles. Normal respiratory effort. No accessory muscle use.  Cardiovascular: Regular rate and rhythm, no murmurs / rubs / gallops. No extremity edema. 2+ pedal pulses.   Abdomen: no tenderness, no masses palpated. No hepatosplenomegaly. Bowel sounds positive.  Musculoskeletal: no clubbing / cyanosis. No joint deformity upper and lower extremities. Good ROM, no contractures. Normal muscle tone.  Skin: no rashes, lesions, ulcers. No induration Neurologic: CN 2-12 grossly intact.  Strength 5/5 in all 4.  Psychiatric: Normal judgment and insight. Alert and oriented x 3. Normal mood.   Labs on Admission: I have personally reviewed following labs and imaging studies  CBC: Recent Labs  Lab 10/22/17 1131  WBC 8.6  HGB 8.7*  HCT 27.5*  MCV 75.3*  PLT 425   Basic Metabolic Panel: Recent Labs  Lab 10/22/17 1131  NA 137  K 3.6  CL 105  CO2 23  GLUCOSE 143*  BUN 37*  CREATININE 1.28*  CALCIUM 8.6*   Cardiac Enzymes: Recent Labs  Lab 10/22/17 1131 10/22/17 1923  TROPONINI <0.03 <0.03   CBG: Recent Labs  Lab 10/22/17 1107  GLUCAP 127*   Urine analysis:      Component Value Date/Time   COLORURINE YELLOW 10/22/2017 1520   APPEARANCEUR CLEAR 10/22/2017 1520   LABSPEC 1.015 10/22/2017 1520   PHURINE 5.5 10/22/2017 1520   GLUCOSEU NEGATIVE 10/22/2017 1520   HGBUR NEGATIVE 10/22/2017 1520   BILIRUBINUR NEGATIVE 10/22/2017 1520   KETONESUR NEGATIVE 10/22/2017 1520   PROTEINUR NEGATIVE 10/22/2017 1520   NITRITE NEGATIVE 10/22/2017 1520   LEUKOCYTESUR NEGATIVE 10/22/2017 1520    Radiological Exams on Admission: Dg Chest 2 View  Result Date: 10/22/2017 CLINICAL DATA:  Weakness. EXAM: CHEST - 2 VIEW COMPARISON:  Radiograph of August 17, 2003. FINDINGS: The heart size and mediastinal contours are within normal limits. No pneumothorax or pleural effusion is noted. Elevated left hemidiaphragm is noted. No pneumothorax  or pleural effusion is noted. No acute pulmonary disease is noted. The visualized skeletal structures are unremarkable. IMPRESSION: No active cardiopulmonary disease. Elevated left hemidiaphragm is noted. Electronically Signed   By: Marijo Conception, M.D.   On: 10/22/2017 12:00    EKG: Independently reviewed.  Nonspecific T wave flattening lead III, V3, no old to compare.   Assessment/Plan Active Problems:   GI bleed   Acute anemia   Weakness  Symptomatic anemia-reports generalized weakness, Hgb 8.7, per EDP last check 3 mths ago- 10.9, but I do not see prior results in epic.  Low MCV- 75, mild increase RDW- 16, suggest iron deficiency anemia.  FOBT positive, denies melena or hematochezia.  Very occasional naproxen use, on daily aspirin. Trops x 2- negative.  -Transfuse 1 unit -Anemia panel - CBc a.m -Will start Protonix  PO 40mg  daily, shortage of IV -Hold home aspirin -Can consider GI evaluation but patient's denies acute blood loss history can follow-up with GI as outpatient - PT eval  Vertigo-hx of, positional.  Resolved with meclizine.  No echo on file. Trops X2 negative. EKG- None acute. -Continue home as needed  meclizine -Will order echo to rule out valvular disease  HTN-blood pressure systolic 320E-334D -Continue home monitoring HCTZ, atenolol 50  GOUT-appears stable. patient takes both allopurinol and colchicine as needed, patient's choice.  DVT prophylaxis: SCDs Code Status: Full confirmed with daughter at bedside Family Communication: Daughter at bedside Disposition Plan: 1- 2days Consults called: None Admission status: Obs, med-surg   Bethena Roys MD [facility] Pager [phone] From 6PM-2AM.  Otherwise please contact night-coverage [email or web address] Password [email or web address]  10/22/2017, 8:16 PM

## 2017-10-22 NOTE — ED Provider Notes (Signed)
[place] EMERGENCY DEPARTMENT Provider Note   CSN: [id number] Arrival date & time: 10/22/17  1044     History   Chief Complaint Chief Complaint  Patient presents with  . Weakness    HPI Kaitlyn Garrison is a 84 y.o. female.  HPI Patient presents with lightheadedness.  States today she was standing and felt as if she was going to pass out.  Does have a history of vertigo and had recently been on Antivert and prednisone.  Now off the prednisone.  No blood in stool or black stools.  Reported does have history of anemia and has been on iron in the past although she is not currently on it.  No chest pain.  Did not feel her spinning today.  No nausea or vomiting.  Has had good oral intake recently. Past Medical History:  Diagnosis Date  . Hypertension   . Vertigo     There are no active problems to display for this patient.   Past Surgical History:  Procedure Laterality Date  . ABDOMINAL HYSTERECTOMY    . TONSILLECTOMY       OB History   None      Home Medications    Prior to Admission medications   Medication Sig Start Date End Date Taking? Authorizing Provider  allopurinol (ZYLOPRIM) 300 MG tablet Take 300 mg by mouth daily.   Yes [provider]  atenolol (TENORMIN) 50 MG tablet Take 50 mg by mouth daily.   Yes [provider]  colchicine 0.6 MG tablet Take 0.6 mg by mouth daily.   Yes [provider]  cyclobenzaprine (FLEXERIL) 5 MG tablet Take 5 mg by mouth 3 (three) times daily as needed for muscle spasms.   Yes [provider]  meclizine (ANTIVERT) 12.5 MG tablet Take 12.5 mg by mouth 3 (three) times daily as needed for dizziness.   Yes [provider]  triamterene-hydrochlorothiazide (MAXZIDE-25) 37.5-25 MG tablet Take 1 tablet by mouth daily.   Yes [provider]    Family History No family history on file.  Social History Social History   Tobacco Use  . Smoking status: Never Smoker  .  Smokeless tobacco: Never Used  Substance Use Topics  . Alcohol use: Never    Frequency: Never  . Drug use: Never     Allergies   Patient has no known allergies.   Review of Systems Review of Systems  Constitutional: Negative for appetite change.  HENT: Negative for congestion.   Respiratory: Negative for shortness of breath.   Cardiovascular: Negative for chest pain.  Gastrointestinal: Negative for abdominal distention.  Genitourinary: Negative for dysuria.  Musculoskeletal: Negative for back pain.  Skin: Negative for rash.  Neurological: Positive for dizziness and light-headedness. Negative for weakness.  Psychiatric/Behavioral: Negative for confusion.     Physical Exam Updated Vital Signs BP (!) 151/69   Pulse 74   Temp 98 F (36.7 C) (Oral)   Resp 16   Ht 5' 3.5" (1.613 m)   Wt 81.6 kg (180 lb)   SpO2 99%   BMI 31.39 kg/m   Physical Exam  Constitutional: She is oriented to person, place, and time. She appears well-developed.  HENT:  Head: Normocephalic.  Eyes: Pupils are equal, round, and reactive to light.  Neck: Neck supple.  Cardiovascular: Normal rate.  Pulmonary/Chest: Effort normal.  Abdominal: Soft.  Musculoskeletal: She exhibits no tenderness.  Neurological: She is alert and oriented to person, place, and time.  Patient with mild  horizontal nystagmus.  Skin: Skin is warm. Capillary refill takes less than 2 seconds.     ED Treatments / Results  Labs (all labs ordered are listed, but only abnormal results are displayed) Labs Reviewed  BASIC METABOLIC PANEL - Abnormal; Notable for the following components:      Result Value   Glucose, Bld 143 (*)    BUN 37 (*)    Creatinine, Ser 1.28 (*)    Calcium 8.6 (*)    GFR calc non Af Amer 36 (*)    GFR calc Af Amer 41 (*)    All other components within normal limits  CBC - Abnormal; Notable for the following components:   RBC 3.65 (*)    Hemoglobin 8.7 (*)    HCT 27.5 (*)    MCV 75.3 (*)    MCH  23.8 (*)    RDW 16.3 (*)    All other components within normal limits  OCCULT BLOOD X 1 CARD TO LAB, STOOL - Abnormal; Notable for the following components:   Fecal Occult Bld POSITIVE (*)    All other components within normal limits  CBG MONITORING, ED - Abnormal; Notable for the following components:   Glucose-Capillary 127 (*)    All other components within normal limits  TROPONIN I  URINALYSIS, ROUTINE W REFLEX MICROSCOPIC    EKG EKG Interpretation  Date/Time:  Tuesday October 22 2017 11:18:02 EDT Ventricular Rate:  72 PR Interval:    QRS Duration: 101 QT Interval:  390 QTC Calculation: 427 R Axis:   47 Text Interpretation:  Sinus rhythm Abnormal R-wave progression, early transition Confirmed by Davonna Belling [phone]) on 10/22/2017 11:25:29 AM   Radiology Dg Chest 2 View  Result Date: 10/22/2017 CLINICAL DATA:  Weakness. EXAM: CHEST - 2 VIEW COMPARISON:  Radiograph of August 17, 2003. FINDINGS: The heart size and mediastinal contours are within normal limits. No pneumothorax or pleural effusion is noted. Elevated left hemidiaphragm is noted. No pneumothorax or pleural effusion is noted. No acute pulmonary disease is noted. The visualized skeletal structures are unremarkable. IMPRESSION: No active cardiopulmonary disease. Elevated left hemidiaphragm is noted. Electronically Signed   By: Marijo Conception, M.D.   On: 10/22/2017 12:00    Procedures Procedures (including critical care time)  Medications Ordered in ED Medications  sodium chloride 0.9 % bolus 500 mL (500 mLs Intravenous New Bag/Given 10/22/17 1222)     Initial Impression / Assessment and Plan / ED Course  I have reviewed the triage vital signs and the nursing notes.  Pertinent labs & imaging results that were available during my care of the patient were reviewed by me and considered in my medical decision making (see chart for details).     Patient presents with lightheadedness dizziness.  Found to be  anemic.  Hemoglobin was 10.9  3 months ago and is now 8.7.  Guaiac positive.  Has not had black stool but was recently on prednisone.  Still feels lightheaded with standing.  Will admit to hospitalist for further evaluation and treatment.  Creatinine is 1.3 today but 3 months ago was 1.5. Final Clinical Impressions(s) / ED Diagnoses   Final diagnoses:  Symptomatic anemia  Gastrointestinal hemorrhage, unspecified gastrointestinal hemorrhage type    ED Discharge Orders    None       Davonna Belling, MD 10/22/17 1342

## 2017-10-22 NOTE — ED Triage Notes (Signed)
Vertigo for a while. This am she had an episode of weakness and felt like she was going to pass out.

## 2017-10-22 NOTE — ED Notes (Signed)
[facility] arrived to transport pt to [facility] 

## 2017-10-23 ENCOUNTER — Observation Stay (HOSPITAL_BASED_OUTPATIENT_CLINIC_OR_DEPARTMENT_OTHER): Payer: MEDICARE

## 2017-10-23 DIAGNOSIS — D6489 Other specified anemias: Secondary | ICD-10-CM | POA: Diagnosis not present

## 2017-10-23 DIAGNOSIS — I10 Essential (primary) hypertension: Secondary | ICD-10-CM

## 2017-10-23 DIAGNOSIS — Z90710 Acquired absence of both cervix and uterus: Secondary | ICD-10-CM | POA: Diagnosis not present

## 2017-10-23 DIAGNOSIS — M109 Gout, unspecified: Secondary | ICD-10-CM | POA: Diagnosis not present

## 2017-10-23 DIAGNOSIS — D649 Anemia, unspecified: Secondary | ICD-10-CM

## 2017-10-23 DIAGNOSIS — R42 Dizziness and giddiness: Secondary | ICD-10-CM | POA: Diagnosis not present

## 2017-10-23 DIAGNOSIS — Z7982 Long term (current) use of aspirin: Secondary | ICD-10-CM | POA: Diagnosis not present

## 2017-10-23 LAB — ECHOCARDIOGRAM COMPLETE
HEIGHTINCHES: 63.5 in
Weight: 2880 oz

## 2017-10-23 LAB — TYPE AND SCREEN
ABO/RH(D): O POS
Antibody Screen: NEGATIVE
UNIT DIVISION: 0

## 2017-10-23 LAB — BPAM RBC
BLOOD PRODUCT EXPIRATION DATE: 201905182359
ISSUE DATE / TIME: 201904232345
Unit Type and Rh: 5100

## 2017-10-23 LAB — HEMOGLOBIN AND HEMATOCRIT, BLOOD
HCT: 30.2 % — ABNORMAL LOW (ref 36.0–46.0)
HEMOGLOBIN: 9.4 g/dL — AB (ref 12.0–15.0)

## 2017-10-23 MED ORDER — PANTOPRAZOLE SODIUM 40 MG PO TBEC: 40.0000 mg | DELAYED_RELEASE_TABLET | Freq: Every day | ORAL | 0 refills | Status: DC

## 2017-10-23 NOTE — Progress Notes (Signed)
Carotid artery duplex has been completed. 1-39% ICA stenosis bilaterally.  10/23/17 10:04 AM Kaitlyn Garrison RVT

## 2017-10-23 NOTE — Progress Notes (Signed)
Paged PT with no response. Will page again.

## 2017-10-23 NOTE — Evaluation (Signed)
Physical Therapy Evaluation Patient Details Name: Kaitlyn Garrison MRN: [id number] DOB: 27-Apr-1925 Today's Date: 10/23/2017   History of Present Illness  84 yo female with onset of increased pain and dizziness to neck then shoulders after going to get her hair done and extending neck over the bowl.  Has some relief but now PT referred to ck inner ear.  Transfused one unit of blood, PMHx:  GI bleed, HTN, gout, vertigo  Clinical Impression  Pt was seen for evaluation only to assess whether vestibular issues are driving her mobility. Noted extreme stiffness in neck with SB 0deg, R rot 45 deg and L rot 70 deg with very poor neck extension and lax ligaments in neck.  LE strength is WFL, and noted BP's and pulses/O2 sats as follow:  Supine 148/59, pulse 75 and O2 sat 98%, sitting 148/64, pulse 81 and O2 sat 97%, standing 141/67, pulse 88 and O2 sat 97%.  O2 sat with walk was 94%.  Pt is significantly stiff and painful in neck and asked for PT referral to work on posture and ROM to neck.  Will anticipate DC home today but PT follow outpatient.    Follow Up Recommendations Outpatient PT    Equipment Recommendations  None recommended by PT    Recommendations for Other Services       Precautions / Restrictions Precautions Precautions: Fall Restrictions Weight Bearing Restrictions: No      Mobility  Bed Mobility Overal bed mobility: Modified Independent             General bed mobility comments: pt using bedrail for support to sit up  Transfers Overall transfer level: Modified independent Equipment used: None                Ambulation/Gait Ambulation/Gait assistance: Supervision;Min guard Ambulation Distance (Feet): 120 Feet Assistive device: None Gait Pattern/deviations: Step-through pattern;Wide base of support;Drifts right/left;Shuffle;Decreased stride length Gait velocity: reduced Gait velocity interpretation: <1.31 ft/sec, indicative of household ambulator General Gait  Details: pt is shifting but not losing her balance  Stairs            Wheelchair Mobility    Modified Rankin (Stroke Patients Only)       Balance Overall balance assessment: Needs assistance Sitting-balance support: Feet supported Sitting balance-Leahy Scale: Good     Standing balance support: No upper extremity supported Standing balance-Leahy Scale: Fair                               Pertinent Vitals/Pain Pain Assessment: Faces Faces Pain Scale: Hurts little more Pain Location: neck with ROM to SB and extend Pain Descriptors / Indicators: Tender;Tightness Pain Intervention(s): Monitored during session;Repositioned    Home Living Family/patient expects to be discharged to:: Private residence Living Arrangements: Children(daughter) Available Help at Discharge: Family Type of Home: House       Home Layout: One level Home Equipment: None Additional Comments: pt has been mod I holding the furniture    Prior Function Level of Independence: Independent;Needs assistance   Gait / Transfers Assistance Needed: I with no AD in the house but holds furniture  ADL's / Homemaking Assistance Needed: daughter helps with cooking and cleaning        Hand Dominance   Dominant Hand: Right    Extremity/Trunk Assessment   Upper Extremity Assessment Upper Extremity Assessment: Overall WFL for tasks assessed    Lower Extremity Assessment Lower Extremity Assessment: Overall WFL for tasks  assessed    Cervical / Trunk Assessment Cervical / Trunk Assessment: Kyphotic  Communication   Communication: No difficulties  Cognition Arousal/Alertness: Awake/alert Behavior During Therapy: WFL for tasks assessed/performed Overall Cognitive Status: Within Functional Limits for tasks assessed                                        General Comments      Exercises     Assessment/Plan    PT Assessment All further PT needs can be met in the next  venue of care  PT Problem List Decreased range of motion;Decreased activity tolerance;Decreased balance;Decreased mobility       PT Treatment Interventions      PT Goals (Current goals can be found in the Care Plan section)  Acute Rehab PT Goals Patient Stated Goal: to feel better and get home PT Goal Formulation: With patient/family Time For Goal Achievement: 11/06/17 Potential to Achieve Goals: Good    Frequency     Barriers to discharge Other (comment)(fall risk of dizziness) Pt is cleared for orthostatic BP's    Co-evaluation               AM-PAC PT "6 Clicks" Daily Activity  Outcome Measure Difficulty turning over in bed (including adjusting bedclothes, sheets and blankets)?: A Little Difficulty moving from lying on back to sitting on the side of the bed? : A Little Difficulty sitting down on and standing up from a chair with arms (e.g., wheelchair, bedside commode, etc,.)?: A Little Help needed moving to and from a bed to chair (including a wheelchair)?: A Little Help needed walking in hospital room?: A Little Help needed climbing 3-5 steps with a railing? : A Little 6 Click Score: 18    End of Session Equipment Utilized During Treatment: Gait belt Activity Tolerance: Patient tolerated treatment well;Patient limited by fatigue Patient left: in bed;with call bell/phone within reach;with family/visitor present Nurse Communication: Mobility status;Other (comment)(cleared orthostatic BP's) PT Visit Diagnosis: Unsteadiness on feet (R26.81);Difficulty in walking, not elsewhere classified (R26.2);Dizziness and giddiness (R42)    Time: 1610-9604 PT Time Calculation (min) (ACUTE ONLY): 26 min   Charges:   PT Evaluation $PT Eval Moderate Complexity: 1 Mod PT Treatments $Gait Training: 8-22 mins   PT G Codes:   PT G-Codes **NOT FOR INPATIENT CLASS** Functional Assessment Tool Used: AM-PAC 6 Clicks Basic Mobility    Ramond Dial 10/23/2017, 4:12 PM   Mee Hives,  PT MS Acute Rehab Dept. Number: [place] [facility] [place]

## 2017-10-23 NOTE — Progress Notes (Signed)
  Echocardiogram 2D Echocardiogram has been performed.  Kaitlyn Garrison 10/23/2017, 12:28 PM

## 2017-10-23 NOTE — Progress Notes (Signed)
Pt alert and oriented.  D/C instructions given, all questions answered.

## 2017-10-23 NOTE — Discharge Summary (Signed)
Physician Discharge Summary  Kaitlyn Garrison ZOX:[id number] DOB: [birth year] DOA: 10/22/2017  PCP: Shon Baton, MD  Admit date: 10/22/2017 Discharge date: 10/23/2017  Admitted From: Home Disposition:  Home  Recommendations for Outpatient Follow-up:  1. Follow up with PCP in 1 week with repeat CBC/BMP 2. Patient might need outpatient GI and/or hematology evaluation  Home Health: Yes  Equipment/Devices: None  Discharge Condition: Stable  CODE STATUS: Full  Diet recommendation: Heart Healthy   Brief/Interim Summary: 84 year old female with history of vertigo, hypertension, gout presented with generalized weakness on 10/22/2017.  Hemoglobin was 8.7 on admission, last hemoglobin was 10.9 apparently.  FOBT was positive.  She was transfused 1 unit of packed red cells.  Discharge Diagnoses:  Active Problems:   GI bleed   Symptomatic anemia   Weakness   HTN (hypertension)   Vertigo   Gout  Symptomatic anemia -Status post 1 unit packed red cells transfusion.  Hemoglobin 9.4 today.  No signs of overt GI bleeding including melena or hematochezia -Continue iron supplementation -Consider outpatient GI and/or hematology evaluation -Continue oral Protonix -Outpatient follow-up with primary care provider with repeat CBC within a week -Hold naproxen  Vertigo -Patient has history of positional vertigo.  Continue meclizine. -Carotid ultrasound negative for significant stenosis -2D echo result is pending: Can be followed up with primary care provider -If patient tolerates PT, will discharge patient home with probable need for home PT -Continue meclizine as needed  Hypertension -Blood pressure stable.  Continue home regimen  Gout -Stable.  Continue home regimen.  Outpatient follow-up  Discharge Instructions  Discharge Instructions    Ambulatory referral to Gastroenterology   Complete by:  As directed    Anemia   What is the reason for referral?:  Other   Call MD for:  difficulty  breathing, headache or visual disturbances   Complete by:  As directed    Call MD for:  extreme fatigue   Complete by:  As directed    Call MD for:  hives   Complete by:  As directed    Call MD for:  persistant dizziness or light-headedness   Complete by:  As directed    Call MD for:  persistant nausea and vomiting   Complete by:  As directed    Call MD for:  severe uncontrolled pain   Complete by:  As directed    Call MD for:  temperature >100.4   Complete by:  As directed    Diet - low sodium heart healthy   Complete by:  As directed    Increase activity slowly   Complete by:  As directed      Allergies as of 10/23/2017   No Known Allergies     Medication List    STOP taking these medications   naproxen sodium 220 MG tablet Commonly known as:  ALEVE     TAKE these medications   acetaminophen 500 MG tablet Commonly known as:  TYLENOL Take 500 mg by mouth every 6 (six) hours as needed.   allopurinol 300 MG tablet Commonly known as:  ZYLOPRIM Take 300 mg by mouth daily.   aspirin 81 MG EC tablet Take 81 mg by mouth daily.   atenolol 50 MG tablet Commonly known as:  TENORMIN Take 50 mg by mouth daily.   BIOFREEZE ROLL-ON EX Apply topically.   colchicine 0.6 MG tablet Take 0.6 mg by mouth daily as needed (for gout flare).   cyclobenzaprine 5 MG tablet Commonly known as:  FLEXERIL Take  5 mg by mouth 3 (three) times daily as needed for muscle spasms.   ferrous sulfate 325 (65 FE) MG EC tablet Take 325 mg by mouth daily with breakfast.   Fish Oil 1200 MG Caps Take 1 capsule by mouth daily at 6 (six) AM.   meclizine 12.5 MG tablet Commonly known as:  ANTIVERT Take 12.5 mg by mouth 3 (three) times daily as needed for dizziness.   pantoprazole 40 MG tablet Commonly known as:  PROTONIX Take 1 tablet (40 mg total) by mouth daily. Start taking on:  10/24/2017   triamterene-hydrochlorothiazide 37.5-25 MG tablet Commonly known as:  MAXZIDE-25 Take 1 tablet by  mouth daily.      Follow-up Information    Shon Baton, MD. Schedule an appointment as soon as possible for a visit in 1 week(s).   Specialty:  Internal Medicine Why:  with repeat CBC/BMP Contact information: [address] [place] [address] [phone]          No Known Allergies  Consultations:  None   Procedures/Studies: Dg Chest 2 View  Result Date: 10/22/2017 CLINICAL DATA:  Weakness. EXAM: CHEST - 2 VIEW COMPARISON:  Radiograph of August 17, 2003. FINDINGS: The heart size and mediastinal contours are within normal limits. No pneumothorax or pleural effusion is noted. Elevated left hemidiaphragm is noted. No pneumothorax or pleural effusion is noted. No acute pulmonary disease is noted. The visualized skeletal structures are unremarkable. IMPRESSION: No active cardiopulmonary disease. Elevated left hemidiaphragm is noted. Electronically Signed   By: Marijo Conception, M.D.   On: 10/22/2017 12:00    Carotid ultrasound:1-39% ICA stenosis bilaterally. 2D echo report pending     Subjective: Patient seen and examined at bedside.  No overnight fever, nausea, vomiting, black or bloody stools.  Discharge Exam: Vitals:   10/23/17 0543 10/23/17 1357  BP: (!) 141/53 (!) 131/56  Pulse: 64 77  Resp: 15 18  Temp: 98.9 F (37.2 C) 98.4 F (36.9 C)  SpO2: 94% 98%   Vitals:   10/23/17 0007 10/23/17 0400 10/23/17 0543 10/23/17 1357  BP: (!) 132/51 (!) 139/52 (!) 141/53 (!) 131/56  Pulse: (!) 59 64 64 77  Resp: 18 18 15 18   Temp: 98.1 F (36.7 C) 98.5 F (36.9 C) 98.9 F (37.2 C) 98.4 F (36.9 C)  TempSrc: Oral Oral Oral   SpO2: 98% 97% 94% 98%  Weight:      Height:        General: Pt is alert, awake, not in acute distress Cardiovascular: Rate controlled, S1/S2 + Respiratory: Bilateral decreased breath sounds at bases  abdominal: Soft, NT, ND, bowel sounds + Extremities: no edema, no cyanosis    The results of significant diagnostics from this  hospitalization (including imaging, microbiology, ancillary and laboratory) are listed below for reference.     Microbiology: No results found for this or any previous visit (from the past 240 hour(s)).   Labs: BNP (last 3 results) No results for input(s): BNP in the last 8760 hours. Basic Metabolic Panel: Recent Labs  Lab 10/22/17 1131  NA 137  K 3.6  CL 105  CO2 23  GLUCOSE 143*  BUN 37*  CREATININE 1.28*  CALCIUM 8.6*   Liver Function Tests: No results for input(s): AST, ALT, ALKPHOS, BILITOT, PROT, ALBUMIN in the last 168 hours. No results for input(s): LIPASE, AMYLASE in the last 168 hours. No results for input(s): AMMONIA in the last 168 hours. CBC: Recent Labs  Lab 10/22/17 1131 10/23/17 6237  WBC 8.6  --   HGB 8.7* 9.4*  HCT 27.5* 30.2*  MCV 75.3*  --   PLT 344  --    Cardiac Enzymes: Recent Labs  Lab 10/22/17 1131 10/22/17 1923  TROPONINI <0.03 <0.03   BNP: Invalid input(s): POCBNP CBG: Recent Labs  Lab 10/22/17 1107  GLUCAP 127*   D-Dimer No results for input(s): DDIMER in the last 72 hours. Hgb A1c No results for input(s): HGBA1C in the last 72 hours. Lipid Profile No results for input(s): CHOL, HDL, LDLCALC, TRIG, CHOLHDL, LDLDIRECT in the last 72 hours. Thyroid function studies No results for input(s): TSH, T4TOTAL, T3FREE, THYROIDAB in the last 72 hours.  Invalid input(s): FREET3 Anemia work up Recent Labs    10/22/17 2028  VITAMINB12 372  FOLATE 14.0  FERRITIN 7*  TIBC 388  IRON 13*  RETICCTPCT 1.6   Urinalysis    Component Value Date/Time   COLORURINE YELLOW 10/22/2017 [place] 10/22/2017 1520   LABSPEC 1.015 10/22/2017 1520   PHURINE 5.5 10/22/2017 1520   GLUCOSEU NEGATIVE 10/22/2017 1520   HGBUR NEGATIVE 10/22/2017 [place] 10/22/2017 1520   KETONESUR NEGATIVE 10/22/2017 1520   PROTEINUR NEGATIVE 10/22/2017 1520   NITRITE NEGATIVE 10/22/2017 1520   LEUKOCYTESUR NEGATIVE  10/22/2017 1520   Sepsis Labs Invalid input(s): PROCALCITONIN,  WBC,  LACTICIDVEN Microbiology No results found for this or any previous visit (from the past 240 hour(s)).   Time coordinating discharge: 35 minutes  SIGNED:   Aline August, MD  [facility] 10/23/2017, 3:00 PM Pager: [phone]  If 7PM-7AM, please contact night-coverage [email or web address] Password [email or web address]

## 2017-10-24 NOTE — Progress Notes (Signed)
Received a call from [place], patient's daughter, requesting HHPT for her mother. Apparently patient was d/ced late 4/24 with [facility] orders and daughter was directed to contact me to arrange today. She had no preference of agency. Contacted [facility] for referral. They accepted referral and will f/u with daughter. Orders were placec last evening. [phone]

## 2017-10-28 DIAGNOSIS — R195 Other fecal abnormalities: Secondary | ICD-10-CM | POA: Diagnosis not present

## 2017-10-28 DIAGNOSIS — D649 Anemia, unspecified: Secondary | ICD-10-CM | POA: Diagnosis not present

## 2017-10-28 DIAGNOSIS — Z6831 Body mass index (BMI) 31.0-31.9, adult: Secondary | ICD-10-CM | POA: Diagnosis not present

## 2017-10-28 DIAGNOSIS — I129 Hypertensive chronic kidney disease with stage 1 through stage 4 chronic kidney disease, or unspecified chronic kidney disease: Secondary | ICD-10-CM | POA: Diagnosis not present

## 2017-10-28 DIAGNOSIS — N184 Chronic kidney disease, stage 4 (severe): Secondary | ICD-10-CM | POA: Diagnosis not present

## 2017-10-28 DIAGNOSIS — R42 Dizziness and giddiness: Secondary | ICD-10-CM | POA: Diagnosis not present

## 2017-10-28 DIAGNOSIS — D509 Iron deficiency anemia, unspecified: Secondary | ICD-10-CM | POA: Diagnosis not present

## 2017-10-29 DIAGNOSIS — M109 Gout, unspecified: Secondary | ICD-10-CM | POA: Diagnosis not present

## 2017-10-29 DIAGNOSIS — I10 Essential (primary) hypertension: Secondary | ICD-10-CM | POA: Diagnosis not present

## 2017-10-29 DIAGNOSIS — D649 Anemia, unspecified: Secondary | ICD-10-CM | POA: Diagnosis not present

## 2017-10-29 DIAGNOSIS — D509 Iron deficiency anemia, unspecified: Secondary | ICD-10-CM | POA: Diagnosis not present

## 2017-10-29 DIAGNOSIS — Z8719 Personal history of other diseases of the digestive system: Secondary | ICD-10-CM | POA: Diagnosis not present

## 2017-10-29 DIAGNOSIS — D6489 Other specified anemias: Secondary | ICD-10-CM | POA: Diagnosis not present

## 2017-10-29 DIAGNOSIS — R29818 Other symptoms and signs involving the nervous system: Secondary | ICD-10-CM | POA: Diagnosis not present

## 2017-10-29 DIAGNOSIS — Z7982 Long term (current) use of aspirin: Secondary | ICD-10-CM | POA: Diagnosis not present

## 2017-10-29 DIAGNOSIS — I129 Hypertensive chronic kidney disease with stage 1 through stage 4 chronic kidney disease, or unspecified chronic kidney disease: Secondary | ICD-10-CM | POA: Diagnosis not present

## 2017-10-29 DIAGNOSIS — H8110 Benign paroxysmal vertigo, unspecified ear: Secondary | ICD-10-CM | POA: Diagnosis not present

## 2017-11-05 DIAGNOSIS — D649 Anemia, unspecified: Secondary | ICD-10-CM | POA: Diagnosis not present

## 2017-11-05 DIAGNOSIS — H8110 Benign paroxysmal vertigo, unspecified ear: Secondary | ICD-10-CM | POA: Diagnosis not present

## 2017-11-05 DIAGNOSIS — Z7982 Long term (current) use of aspirin: Secondary | ICD-10-CM | POA: Diagnosis not present

## 2017-11-05 DIAGNOSIS — Z8719 Personal history of other diseases of the digestive system: Secondary | ICD-10-CM | POA: Diagnosis not present

## 2017-11-05 DIAGNOSIS — I10 Essential (primary) hypertension: Secondary | ICD-10-CM | POA: Diagnosis not present

## 2017-11-05 DIAGNOSIS — R29818 Other symptoms and signs involving the nervous system: Secondary | ICD-10-CM | POA: Diagnosis not present

## 2017-11-05 DIAGNOSIS — M109 Gout, unspecified: Secondary | ICD-10-CM | POA: Diagnosis not present

## 2017-11-06 DIAGNOSIS — D509 Iron deficiency anemia, unspecified: Secondary | ICD-10-CM | POA: Diagnosis not present

## 2017-11-08 ENCOUNTER — Encounter: Payer: Self-pay | Admitting: Internal Medicine

## 2017-11-28 DIAGNOSIS — Z800 Family history of malignant neoplasm of digestive organs: Secondary | ICD-10-CM | POA: Diagnosis not present

## 2017-11-28 DIAGNOSIS — K921 Melena: Secondary | ICD-10-CM | POA: Diagnosis not present

## 2017-11-28 DIAGNOSIS — D500 Iron deficiency anemia secondary to blood loss (chronic): Secondary | ICD-10-CM | POA: Diagnosis not present

## 2017-12-06 DIAGNOSIS — K3189 Other diseases of stomach and duodenum: Secondary | ICD-10-CM | POA: Diagnosis not present

## 2017-12-06 DIAGNOSIS — K449 Diaphragmatic hernia without obstruction or gangrene: Secondary | ICD-10-CM | POA: Diagnosis not present

## 2017-12-06 DIAGNOSIS — D509 Iron deficiency anemia, unspecified: Secondary | ICD-10-CM | POA: Diagnosis not present

## 2017-12-11 ENCOUNTER — Other Ambulatory Visit: Payer: Self-pay | Admitting: Gastroenterology

## 2017-12-11 DIAGNOSIS — K449 Diaphragmatic hernia without obstruction or gangrene: Secondary | ICD-10-CM

## 2017-12-11 DIAGNOSIS — Z800 Family history of malignant neoplasm of digestive organs: Secondary | ICD-10-CM

## 2017-12-11 DIAGNOSIS — K921 Melena: Secondary | ICD-10-CM

## 2017-12-23 ENCOUNTER — Other Ambulatory Visit: Payer: Medicare HMO

## 2017-12-25 ENCOUNTER — Ambulatory Visit
Admission: RE | Admit: 2017-12-25 | Discharge: 2017-12-25 | Disposition: A | Discharge status: Home / Self Care | Payer: Medicare HMO | Source: Ambulatory Visit | Attending: Gastroenterology | Admitting: Gastroenterology

## 2017-12-25 DIAGNOSIS — K449 Diaphragmatic hernia without obstruction or gangrene: Secondary | ICD-10-CM

## 2017-12-25 DIAGNOSIS — K760 Fatty (change of) liver, not elsewhere classified: Secondary | ICD-10-CM | POA: Diagnosis not present

## 2017-12-25 DIAGNOSIS — D649 Anemia, unspecified: Secondary | ICD-10-CM | POA: Diagnosis not present

## 2017-12-25 DIAGNOSIS — K921 Melena: Secondary | ICD-10-CM

## 2017-12-25 DIAGNOSIS — Z800 Family history of malignant neoplasm of digestive organs: Secondary | ICD-10-CM

## 2018-01-03 ENCOUNTER — Other Ambulatory Visit: Payer: Medicare HMO

## 2018-01-17 DIAGNOSIS — E559 Vitamin D deficiency, unspecified: Secondary | ICD-10-CM | POA: Diagnosis not present

## 2018-01-17 DIAGNOSIS — M25512 Pain in left shoulder: Secondary | ICD-10-CM | POA: Diagnosis not present

## 2018-01-17 DIAGNOSIS — I10 Essential (primary) hypertension: Secondary | ICD-10-CM | POA: Diagnosis not present

## 2018-01-17 DIAGNOSIS — E099 Drug or chemical induced diabetes mellitus without complications: Secondary | ICD-10-CM | POA: Diagnosis not present

## 2018-01-17 DIAGNOSIS — M109 Gout, unspecified: Secondary | ICD-10-CM | POA: Diagnosis not present

## 2018-01-17 DIAGNOSIS — E7849 Other hyperlipidemia: Secondary | ICD-10-CM | POA: Diagnosis not present

## 2018-01-17 DIAGNOSIS — R82998 Other abnormal findings in urine: Secondary | ICD-10-CM | POA: Diagnosis not present

## 2018-01-21 DIAGNOSIS — K921 Melena: Secondary | ICD-10-CM | POA: Diagnosis not present

## 2018-01-21 DIAGNOSIS — D500 Iron deficiency anemia secondary to blood loss (chronic): Secondary | ICD-10-CM | POA: Diagnosis not present

## 2018-01-24 DIAGNOSIS — N184 Chronic kidney disease, stage 4 (severe): Secondary | ICD-10-CM | POA: Diagnosis not present

## 2018-01-24 DIAGNOSIS — Z0000 Encounter for general adult medical examination without abnormal findings: Secondary | ICD-10-CM | POA: Diagnosis not present

## 2018-01-24 DIAGNOSIS — I700 Atherosclerosis of aorta: Secondary | ICD-10-CM | POA: Diagnosis not present

## 2018-01-24 DIAGNOSIS — I129 Hypertensive chronic kidney disease with stage 1 through stage 4 chronic kidney disease, or unspecified chronic kidney disease: Secondary | ICD-10-CM | POA: Diagnosis not present

## 2018-01-24 DIAGNOSIS — Z23 Encounter for immunization: Secondary | ICD-10-CM | POA: Diagnosis not present

## 2018-01-24 DIAGNOSIS — D692 Other nonthrombocytopenic purpura: Secondary | ICD-10-CM | POA: Diagnosis not present

## 2018-01-24 DIAGNOSIS — R195 Other fecal abnormalities: Secondary | ICD-10-CM | POA: Diagnosis not present

## 2018-01-24 DIAGNOSIS — D508 Other iron deficiency anemias: Secondary | ICD-10-CM | POA: Diagnosis not present

## 2018-01-24 DIAGNOSIS — E099 Drug or chemical induced diabetes mellitus without complications: Secondary | ICD-10-CM | POA: Diagnosis not present

## 2018-01-24 DIAGNOSIS — M316 Other giant cell arteritis: Secondary | ICD-10-CM | POA: Diagnosis not present

## 2018-01-24 DIAGNOSIS — E559 Vitamin D deficiency, unspecified: Secondary | ICD-10-CM | POA: Diagnosis not present

## 2018-01-29 DIAGNOSIS — M859 Disorder of bone density and structure, unspecified: Secondary | ICD-10-CM | POA: Diagnosis not present

## 2018-02-04 DIAGNOSIS — D500 Iron deficiency anemia secondary to blood loss (chronic): Secondary | ICD-10-CM | POA: Diagnosis not present

## 2018-02-26 DIAGNOSIS — Z803 Family history of malignant neoplasm of breast: Secondary | ICD-10-CM | POA: Diagnosis not present

## 2018-02-26 DIAGNOSIS — Z1231 Encounter for screening mammogram for malignant neoplasm of breast: Secondary | ICD-10-CM | POA: Diagnosis not present

## 2018-07-23 DIAGNOSIS — R69 Illness, unspecified: Secondary | ICD-10-CM | POA: Diagnosis not present

## 2018-07-25 DIAGNOSIS — I872 Venous insufficiency (chronic) (peripheral): Secondary | ICD-10-CM | POA: Diagnosis not present

## 2018-07-25 DIAGNOSIS — N184 Chronic kidney disease, stage 4 (severe): Secondary | ICD-10-CM | POA: Diagnosis not present

## 2018-07-25 DIAGNOSIS — E099 Drug or chemical induced diabetes mellitus without complications: Secondary | ICD-10-CM | POA: Diagnosis not present

## 2018-07-25 DIAGNOSIS — M316 Other giant cell arteritis: Secondary | ICD-10-CM | POA: Diagnosis not present

## 2018-07-25 DIAGNOSIS — I700 Atherosclerosis of aorta: Secondary | ICD-10-CM | POA: Diagnosis not present

## 2018-07-25 DIAGNOSIS — D692 Other nonthrombocytopenic purpura: Secondary | ICD-10-CM | POA: Diagnosis not present

## 2018-07-25 DIAGNOSIS — Z6832 Body mass index (BMI) 32.0-32.9, adult: Secondary | ICD-10-CM | POA: Diagnosis not present

## 2018-07-25 DIAGNOSIS — Z23 Encounter for immunization: Secondary | ICD-10-CM | POA: Diagnosis not present

## 2018-07-25 DIAGNOSIS — I129 Hypertensive chronic kidney disease with stage 1 through stage 4 chronic kidney disease, or unspecified chronic kidney disease: Secondary | ICD-10-CM | POA: Diagnosis not present

## 2018-07-25 DIAGNOSIS — D509 Iron deficiency anemia, unspecified: Secondary | ICD-10-CM | POA: Diagnosis not present

## 2018-08-06 DIAGNOSIS — R69 Illness, unspecified: Secondary | ICD-10-CM | POA: Diagnosis not present

## 2018-09-03 DIAGNOSIS — R69 Illness, unspecified: Secondary | ICD-10-CM | POA: Diagnosis not present

## 2019-01-23 DIAGNOSIS — E7849 Other hyperlipidemia: Secondary | ICD-10-CM | POA: Diagnosis not present

## 2019-01-23 DIAGNOSIS — M109 Gout, unspecified: Secondary | ICD-10-CM | POA: Diagnosis not present

## 2019-01-23 DIAGNOSIS — E099 Drug or chemical induced diabetes mellitus without complications: Secondary | ICD-10-CM | POA: Diagnosis not present

## 2019-01-23 DIAGNOSIS — M25512 Pain in left shoulder: Secondary | ICD-10-CM | POA: Diagnosis not present

## 2019-01-30 DIAGNOSIS — R195 Other fecal abnormalities: Secondary | ICD-10-CM | POA: Diagnosis not present

## 2019-01-30 DIAGNOSIS — M8580 Other specified disorders of bone density and structure, unspecified site: Secondary | ICD-10-CM | POA: Diagnosis not present

## 2019-01-30 DIAGNOSIS — D692 Other nonthrombocytopenic purpura: Secondary | ICD-10-CM | POA: Diagnosis not present

## 2019-01-30 DIAGNOSIS — J309 Allergic rhinitis, unspecified: Secondary | ICD-10-CM | POA: Diagnosis not present

## 2019-01-30 DIAGNOSIS — I700 Atherosclerosis of aorta: Secondary | ICD-10-CM | POA: Diagnosis not present

## 2019-01-30 DIAGNOSIS — E1122 Type 2 diabetes mellitus with diabetic chronic kidney disease: Secondary | ICD-10-CM | POA: Diagnosis not present

## 2019-01-30 DIAGNOSIS — Z0000 Encounter for general adult medical examination without abnormal findings: Secondary | ICD-10-CM | POA: Diagnosis not present

## 2019-01-30 DIAGNOSIS — M316 Other giant cell arteritis: Secondary | ICD-10-CM | POA: Diagnosis not present

## 2019-01-30 DIAGNOSIS — I129 Hypertensive chronic kidney disease with stage 1 through stage 4 chronic kidney disease, or unspecified chronic kidney disease: Secondary | ICD-10-CM | POA: Diagnosis not present

## 2019-01-30 DIAGNOSIS — Z1331 Encounter for screening for depression: Secondary | ICD-10-CM | POA: Diagnosis not present

## 2019-01-30 DIAGNOSIS — D509 Iron deficiency anemia, unspecified: Secondary | ICD-10-CM | POA: Diagnosis not present

## 2019-02-27 DIAGNOSIS — Z1231 Encounter for screening mammogram for malignant neoplasm of breast: Secondary | ICD-10-CM | POA: Diagnosis not present

## 2019-02-27 DIAGNOSIS — Z803 Family history of malignant neoplasm of breast: Secondary | ICD-10-CM | POA: Diagnosis not present

## 2019-05-05 DIAGNOSIS — R69 Illness, unspecified: Secondary | ICD-10-CM | POA: Diagnosis not present

## 2019-08-04 DIAGNOSIS — N184 Chronic kidney disease, stage 4 (severe): Secondary | ICD-10-CM | POA: Diagnosis not present

## 2019-08-04 DIAGNOSIS — E119 Type 2 diabetes mellitus without complications: Secondary | ICD-10-CM | POA: Diagnosis not present

## 2019-08-04 DIAGNOSIS — R42 Dizziness and giddiness: Secondary | ICD-10-CM | POA: Diagnosis not present

## 2019-08-04 DIAGNOSIS — I129 Hypertensive chronic kidney disease with stage 1 through stage 4 chronic kidney disease, or unspecified chronic kidney disease: Secondary | ICD-10-CM | POA: Diagnosis not present

## 2019-08-04 DIAGNOSIS — D692 Other nonthrombocytopenic purpura: Secondary | ICD-10-CM | POA: Diagnosis not present

## 2019-08-04 DIAGNOSIS — M316 Other giant cell arteritis: Secondary | ICD-10-CM | POA: Diagnosis not present

## 2020-02-02 DIAGNOSIS — E119 Type 2 diabetes mellitus without complications: Secondary | ICD-10-CM | POA: Diagnosis not present

## 2020-02-02 DIAGNOSIS — M859 Disorder of bone density and structure, unspecified: Secondary | ICD-10-CM | POA: Diagnosis not present

## 2020-02-02 DIAGNOSIS — Z0000 Encounter for general adult medical examination without abnormal findings: Secondary | ICD-10-CM | POA: Diagnosis not present

## 2020-02-02 DIAGNOSIS — M109 Gout, unspecified: Secondary | ICD-10-CM | POA: Diagnosis not present

## 2020-02-02 DIAGNOSIS — E7849 Other hyperlipidemia: Secondary | ICD-10-CM | POA: Diagnosis not present

## 2020-02-09 DIAGNOSIS — I700 Atherosclerosis of aorta: Secondary | ICD-10-CM | POA: Diagnosis not present

## 2020-02-09 DIAGNOSIS — Z0000 Encounter for general adult medical examination without abnormal findings: Secondary | ICD-10-CM | POA: Diagnosis not present

## 2020-02-09 DIAGNOSIS — D692 Other nonthrombocytopenic purpura: Secondary | ICD-10-CM | POA: Diagnosis not present

## 2020-02-09 DIAGNOSIS — E785 Hyperlipidemia, unspecified: Secondary | ICD-10-CM | POA: Diagnosis not present

## 2020-02-09 DIAGNOSIS — E1122 Type 2 diabetes mellitus with diabetic chronic kidney disease: Secondary | ICD-10-CM | POA: Diagnosis not present

## 2020-02-09 DIAGNOSIS — N184 Chronic kidney disease, stage 4 (severe): Secondary | ICD-10-CM | POA: Diagnosis not present

## 2020-02-09 DIAGNOSIS — M109 Gout, unspecified: Secondary | ICD-10-CM | POA: Diagnosis not present

## 2020-02-09 DIAGNOSIS — R82998 Other abnormal findings in urine: Secondary | ICD-10-CM | POA: Diagnosis not present

## 2020-02-09 DIAGNOSIS — I129 Hypertensive chronic kidney disease with stage 1 through stage 4 chronic kidney disease, or unspecified chronic kidney disease: Secondary | ICD-10-CM | POA: Diagnosis not present

## 2020-02-09 DIAGNOSIS — E559 Vitamin D deficiency, unspecified: Secondary | ICD-10-CM | POA: Diagnosis not present

## 2020-02-09 DIAGNOSIS — D509 Iron deficiency anemia, unspecified: Secondary | ICD-10-CM | POA: Diagnosis not present

## 2020-04-18 DIAGNOSIS — Z1231 Encounter for screening mammogram for malignant neoplasm of breast: Secondary | ICD-10-CM | POA: Diagnosis not present

## 2020-06-02 DIAGNOSIS — L570 Actinic keratosis: Secondary | ICD-10-CM | POA: Diagnosis not present

## 2020-06-02 DIAGNOSIS — L578 Other skin changes due to chronic exposure to nonionizing radiation: Secondary | ICD-10-CM | POA: Diagnosis not present

## 2020-06-02 DIAGNOSIS — L821 Other seborrheic keratosis: Secondary | ICD-10-CM | POA: Diagnosis not present

## 2020-06-02 DIAGNOSIS — C44629 Squamous cell carcinoma of skin of left upper limb, including shoulder: Secondary | ICD-10-CM | POA: Diagnosis not present

## 2020-06-02 DIAGNOSIS — L814 Other melanin hyperpigmentation: Secondary | ICD-10-CM | POA: Diagnosis not present

## 2020-06-02 DIAGNOSIS — D485 Neoplasm of uncertain behavior of skin: Secondary | ICD-10-CM | POA: Diagnosis not present

## 2020-06-21 DIAGNOSIS — R69 Illness, unspecified: Secondary | ICD-10-CM | POA: Diagnosis not present

## 2020-07-20 DIAGNOSIS — L989 Disorder of the skin and subcutaneous tissue, unspecified: Secondary | ICD-10-CM | POA: Diagnosis not present

## 2020-07-20 DIAGNOSIS — L905 Scar conditions and fibrosis of skin: Secondary | ICD-10-CM | POA: Diagnosis not present

## 2020-07-20 DIAGNOSIS — C44629 Squamous cell carcinoma of skin of left upper limb, including shoulder: Secondary | ICD-10-CM | POA: Diagnosis not present

## 2020-08-12 DIAGNOSIS — I129 Hypertensive chronic kidney disease with stage 1 through stage 4 chronic kidney disease, or unspecified chronic kidney disease: Secondary | ICD-10-CM | POA: Diagnosis not present

## 2020-08-12 DIAGNOSIS — M109 Gout, unspecified: Secondary | ICD-10-CM | POA: Diagnosis not present

## 2020-08-12 DIAGNOSIS — M316 Other giant cell arteritis: Secondary | ICD-10-CM | POA: Diagnosis not present

## 2020-08-12 DIAGNOSIS — D509 Iron deficiency anemia, unspecified: Secondary | ICD-10-CM | POA: Diagnosis not present

## 2020-08-12 DIAGNOSIS — E785 Hyperlipidemia, unspecified: Secondary | ICD-10-CM | POA: Diagnosis not present

## 2020-08-12 DIAGNOSIS — E559 Vitamin D deficiency, unspecified: Secondary | ICD-10-CM | POA: Diagnosis not present

## 2020-08-12 DIAGNOSIS — E1122 Type 2 diabetes mellitus with diabetic chronic kidney disease: Secondary | ICD-10-CM | POA: Diagnosis not present

## 2020-08-12 DIAGNOSIS — I700 Atherosclerosis of aorta: Secondary | ICD-10-CM | POA: Diagnosis not present

## 2020-08-12 DIAGNOSIS — D692 Other nonthrombocytopenic purpura: Secondary | ICD-10-CM | POA: Diagnosis not present

## 2020-08-12 DIAGNOSIS — N184 Chronic kidney disease, stage 4 (severe): Secondary | ICD-10-CM | POA: Diagnosis not present

## 2020-08-12 DIAGNOSIS — I872 Venous insufficiency (chronic) (peripheral): Secondary | ICD-10-CM | POA: Diagnosis not present

## 2020-08-31 DIAGNOSIS — L570 Actinic keratosis: Secondary | ICD-10-CM | POA: Diagnosis not present

## 2020-12-14 DIAGNOSIS — M170 Bilateral primary osteoarthritis of knee: Secondary | ICD-10-CM | POA: Diagnosis not present

## 2021-02-07 DIAGNOSIS — E559 Vitamin D deficiency, unspecified: Secondary | ICD-10-CM | POA: Diagnosis not present

## 2021-02-07 DIAGNOSIS — M109 Gout, unspecified: Secondary | ICD-10-CM | POA: Diagnosis not present

## 2021-02-07 DIAGNOSIS — E1122 Type 2 diabetes mellitus with diabetic chronic kidney disease: Secondary | ICD-10-CM | POA: Diagnosis not present

## 2021-02-07 DIAGNOSIS — E785 Hyperlipidemia, unspecified: Secondary | ICD-10-CM | POA: Diagnosis not present

## 2021-02-14 DIAGNOSIS — E785 Hyperlipidemia, unspecified: Secondary | ICD-10-CM | POA: Diagnosis not present

## 2021-02-14 DIAGNOSIS — I129 Hypertensive chronic kidney disease with stage 1 through stage 4 chronic kidney disease, or unspecified chronic kidney disease: Secondary | ICD-10-CM | POA: Diagnosis not present

## 2021-02-14 DIAGNOSIS — D692 Other nonthrombocytopenic purpura: Secondary | ICD-10-CM | POA: Diagnosis not present

## 2021-02-14 DIAGNOSIS — Z1339 Encounter for screening examination for other mental health and behavioral disorders: Secondary | ICD-10-CM | POA: Diagnosis not present

## 2021-02-14 DIAGNOSIS — E1122 Type 2 diabetes mellitus with diabetic chronic kidney disease: Secondary | ICD-10-CM | POA: Diagnosis not present

## 2021-02-14 DIAGNOSIS — I700 Atherosclerosis of aorta: Secondary | ICD-10-CM | POA: Diagnosis not present

## 2021-02-14 DIAGNOSIS — Z1331 Encounter for screening for depression: Secondary | ICD-10-CM | POA: Diagnosis not present

## 2021-02-14 DIAGNOSIS — D509 Iron deficiency anemia, unspecified: Secondary | ICD-10-CM | POA: Diagnosis not present

## 2021-02-14 DIAGNOSIS — N184 Chronic kidney disease, stage 4 (severe): Secondary | ICD-10-CM | POA: Diagnosis not present

## 2021-02-14 DIAGNOSIS — M109 Gout, unspecified: Secondary | ICD-10-CM | POA: Diagnosis not present

## 2021-02-14 DIAGNOSIS — R82998 Other abnormal findings in urine: Secondary | ICD-10-CM | POA: Diagnosis not present

## 2021-02-14 DIAGNOSIS — E559 Vitamin D deficiency, unspecified: Secondary | ICD-10-CM | POA: Diagnosis not present

## 2021-02-14 DIAGNOSIS — Z0000 Encounter for general adult medical examination without abnormal findings: Secondary | ICD-10-CM | POA: Diagnosis not present

## 2021-04-26 DIAGNOSIS — Z1231 Encounter for screening mammogram for malignant neoplasm of breast: Secondary | ICD-10-CM | POA: Diagnosis not present

## 2021-05-22 DIAGNOSIS — M545 Low back pain, unspecified: Secondary | ICD-10-CM | POA: Diagnosis not present

## 2021-06-06 DIAGNOSIS — L578 Other skin changes due to chronic exposure to nonionizing radiation: Secondary | ICD-10-CM | POA: Diagnosis not present

## 2021-06-06 DIAGNOSIS — D229 Melanocytic nevi, unspecified: Secondary | ICD-10-CM | POA: Diagnosis not present

## 2021-06-06 DIAGNOSIS — Z23 Encounter for immunization: Secondary | ICD-10-CM | POA: Diagnosis not present

## 2021-06-06 DIAGNOSIS — D1801 Hemangioma of skin and subcutaneous tissue: Secondary | ICD-10-CM | POA: Diagnosis not present

## 2021-06-06 DIAGNOSIS — L570 Actinic keratosis: Secondary | ICD-10-CM | POA: Diagnosis not present

## 2021-06-06 DIAGNOSIS — Z85828 Personal history of other malignant neoplasm of skin: Secondary | ICD-10-CM | POA: Diagnosis not present

## 2021-06-06 DIAGNOSIS — L814 Other melanin hyperpigmentation: Secondary | ICD-10-CM | POA: Diagnosis not present

## 2021-06-06 DIAGNOSIS — L821 Other seborrheic keratosis: Secondary | ICD-10-CM | POA: Diagnosis not present

## 2021-06-09 DIAGNOSIS — M5451 Vertebrogenic low back pain: Secondary | ICD-10-CM | POA: Diagnosis not present

## 2021-06-21 DIAGNOSIS — M545 Low back pain, unspecified: Secondary | ICD-10-CM | POA: Diagnosis not present

## 2021-06-23 DIAGNOSIS — M5136 Other intervertebral disc degeneration, lumbar region: Secondary | ICD-10-CM | POA: Diagnosis not present

## 2021-06-23 DIAGNOSIS — M419 Scoliosis, unspecified: Secondary | ICD-10-CM | POA: Diagnosis not present

## 2021-06-23 DIAGNOSIS — M5451 Vertebrogenic low back pain: Secondary | ICD-10-CM | POA: Diagnosis not present

## 2021-08-18 DIAGNOSIS — M316 Other giant cell arteritis: Secondary | ICD-10-CM | POA: Diagnosis not present

## 2021-08-18 DIAGNOSIS — M109 Gout, unspecified: Secondary | ICD-10-CM | POA: Diagnosis not present

## 2021-08-18 DIAGNOSIS — E785 Hyperlipidemia, unspecified: Secondary | ICD-10-CM | POA: Diagnosis not present

## 2021-08-18 DIAGNOSIS — E1122 Type 2 diabetes mellitus with diabetic chronic kidney disease: Secondary | ICD-10-CM | POA: Diagnosis not present

## 2021-08-18 DIAGNOSIS — I700 Atherosclerosis of aorta: Secondary | ICD-10-CM | POA: Diagnosis not present

## 2021-08-18 DIAGNOSIS — D509 Iron deficiency anemia, unspecified: Secondary | ICD-10-CM | POA: Diagnosis not present

## 2021-08-18 DIAGNOSIS — E559 Vitamin D deficiency, unspecified: Secondary | ICD-10-CM | POA: Diagnosis not present

## 2021-08-18 DIAGNOSIS — I129 Hypertensive chronic kidney disease with stage 1 through stage 4 chronic kidney disease, or unspecified chronic kidney disease: Secondary | ICD-10-CM | POA: Diagnosis not present

## 2021-08-18 DIAGNOSIS — N184 Chronic kidney disease, stage 4 (severe): Secondary | ICD-10-CM | POA: Diagnosis not present

## 2021-08-18 DIAGNOSIS — D692 Other nonthrombocytopenic purpura: Secondary | ICD-10-CM | POA: Diagnosis not present

## 2021-10-10 ENCOUNTER — Encounter (HOSPITAL_BASED_OUTPATIENT_CLINIC_OR_DEPARTMENT_OTHER): Payer: Self-pay

## 2021-10-10 ENCOUNTER — Emergency Department (HOSPITAL_BASED_OUTPATIENT_CLINIC_OR_DEPARTMENT_OTHER)
Admission: EM | Admit: 2021-10-10 | Discharge: 2021-10-10 | Disposition: A | Discharge status: Home / Self Care | Payer: Medicare HMO | Attending: Emergency Medicine | Admitting: Emergency Medicine

## 2021-10-10 ENCOUNTER — Other Ambulatory Visit: Payer: Self-pay

## 2021-10-10 ENCOUNTER — Emergency Department (HOSPITAL_BASED_OUTPATIENT_CLINIC_OR_DEPARTMENT_OTHER): Payer: Medicare HMO

## 2021-10-10 DIAGNOSIS — M25571 Pain in right ankle and joints of right foot: Secondary | ICD-10-CM | POA: Diagnosis not present

## 2021-10-10 DIAGNOSIS — M7989 Other specified soft tissue disorders: Secondary | ICD-10-CM | POA: Diagnosis not present

## 2021-10-10 DIAGNOSIS — Z79899 Other long term (current) drug therapy: Secondary | ICD-10-CM | POA: Diagnosis not present

## 2021-10-10 DIAGNOSIS — R600 Localized edema: Secondary | ICD-10-CM | POA: Diagnosis not present

## 2021-10-10 DIAGNOSIS — Z7982 Long term (current) use of aspirin: Secondary | ICD-10-CM | POA: Insufficient documentation

## 2021-10-10 MED ORDER — IBUPROFEN 600 MG PO TABS: 600.0000 mg | ORAL_TABLET | Freq: Four times a day (QID) | ORAL | 0 refills | Status: DC | PRN

## 2021-10-10 MED ORDER — ACETAMINOPHEN 325 MG PO TABS: 650.0000 mg | ORAL_TABLET | Freq: Four times a day (QID) | ORAL | 0 refills | Status: DC | PRN

## 2021-10-10 NOTE — ED Provider Notes (Signed)
?[facility] EMERGENCY DEPARTMENT ?Provider Note ? ? ?CSN: [id number] ?Arrival date & time: 10/10/21  1315 ? ?  ? ?History ? ?Chief Complaint  ?Patient presents with  ? Leg Pain  ? ? ?Kaitlyn Garrison is a 88 y.o. female. ? ? Patient as above with significant medical history as below, including htn, vertigo who presents to the ED with complaint of right ankle pain. ? ?Location:  right ankle ?Duration:  2-3 days ?Onset:  gradual ?Timing:  constant ?Description:  aching, throbbing pain ?Severity:  mild ?Exacerbating/Alleviating Factors:  worse with walking ?Associated Symptoms:  mild ankle swelling ?Pertinent Negatives:  no fevers, chills, chest pain, dyspnea, nausea or vomiting.  No change in bowel or bladder function.  No fevers or chills.  No recent ankle injections.  No recent trauma. ? ?Patient took Tylenol yesterday which did not resolve the discomfort ? ? ? ?Past Medical History: ?No date: Hypertension ?No date: Vertigo ? ?Past Surgical History: ?No date: ABDOMINAL HYSTERECTOMY ?No date: TONSILLECTOMY  ? ? ?The history is provided by the patient and a relative. No language interpreter was used.  ?Leg Pain ?Associated symptoms: no back pain and no fever   ? ?  ? ?Home Medications ?Prior to Admission medications   ?Medication Sig Start Date End Date Taking? Authorizing Provider  ?acetaminophen (TYLENOL) 500 MG tablet Take 500 mg by mouth every 6 (six) hours as needed.    [provider]  ?allopurinol (ZYLOPRIM) 300 MG tablet Take 300 mg by mouth daily.    [provider]  ?aspirin 81 MG EC tablet Take 81 mg by mouth daily.    [provider]  ?atenolol (TENORMIN) 50 MG tablet Take 50 mg by mouth daily.    [provider]  ?colchicine 0.6 MG tablet Take 0.6 mg by mouth daily as needed (for gout flare).     [provider]  ?cyclobenzaprine (FLEXERIL) 5 MG tablet Take 5 mg by mouth 3 (three) times daily as needed for muscle spasms.    [provider]   ?ferrous sulfate 325 (65 FE) MG EC tablet Take 325 mg by mouth daily with breakfast.    [provider]  ?meclizine (ANTIVERT) 12.5 MG tablet Take 12.5 mg by mouth 3 (three) times daily as needed for dizziness.    [provider]  ?Menthol, Topical Analgesic, (BIOFREEZE ROLL-ON EX) Apply topically.    [provider]  ?Omega-3 Fatty Acids (FISH OIL) 1200 MG CAPS Take 1 capsule by mouth daily at 6 (six) AM.    [provider]  ?pantoprazole (PROTONIX) 40 MG tablet Take 1 tablet (40 mg total) by mouth daily. 10/24/17   Aline August, MD  ?triamterene-hydrochlorothiazide (MAXZIDE-25) 37.5-25 MG tablet Take 1 tablet by mouth daily.    [provider]  ?   ? ?Allergies    ?Codeine and Morphine   ? ?Review of Systems   ?Review of Systems  ?Constitutional:  Negative for activity change and fever.  ?HENT:  Negative for facial swelling and trouble swallowing.   ?Eyes:  Negative for discharge and redness.  ?Respiratory:  Negative for cough and shortness of breath.   ?Cardiovascular:  Negative for chest pain and palpitations.  ?Gastrointestinal:  Negative for abdominal pain and nausea.  ?Genitourinary:  Negative for dysuria and flank pain.  ?Musculoskeletal:  Positive for arthralgias and joint swelling. Negative for back pain and gait problem.  ?Skin:  Negative for pallor and rash.  ?Neurological:  Negative for syncope and headaches.  ? ?  Physical Exam ?Updated Vital Signs ?BP (!) 143/54   Pulse 87   Temp 98.1 ?F (36.7 ?C) (Oral)   Resp 16   Ht 5' 3.5" (1.613 m)   Wt 84.4 kg   SpO2 99%   BMI 32.43 kg/m?  ?Physical Exam ?Vitals and nursing note reviewed.  ?Constitutional:   ?   General: She is not in acute distress. ?   Appearance: Normal appearance.  ?HENT:  ?   Head: Normocephalic and atraumatic.  ?   Right Ear: External ear normal.  ?   Left Ear: External ear normal.  ?   Nose: Nose normal.  ?   Mouth/Throat:  ?   Mouth: Mucous membranes are moist.  ?Eyes:  ?   General: No  scleral icterus.    ?   Right eye: No discharge.     ?   Left eye: No discharge.  ?Cardiovascular:  ?   Rate and Rhythm: Normal rate and regular rhythm.  ?   Pulses: Normal pulses.  ?   Heart sounds: Normal heart sounds.  ?Pulmonary:  ?   Effort: Pulmonary effort is normal. No respiratory distress.  ?   Breath sounds: Normal breath sounds. No wheezing.  ?Abdominal:  ?   General: Abdomen is flat.  ?   Palpations: Abdomen is soft.  ?   Tenderness: There is no abdominal tenderness.  ?Musculoskeletal:     ?   General: Normal range of motion.  ?   Cervical back: Normal range of motion.  ?   Right lower leg: Edema present.  ?   Left lower leg: No edema.  ?   Comments: Right ankle and pedal edema.  Pitting. ?Right ankle is not disproportionately warm ?Full range of motion to bilateral ankles without discomfort. ?No pain to ipsilateral knee or hip. ?2+ DP pulses equal bilateral. ?Both feet are neurovascularly intact  ?  ?Skin: ?   General: Skin is warm and dry.  ?   Capillary Refill: Capillary refill takes less than 2 seconds.  ?Neurological:  ?   Mental Status: She is alert and oriented to person, place, and time.  ?   GCS: GCS eye subscore is 4. GCS verbal subscore is 5. GCS motor subscore is 6.  ?Psychiatric:     ?   Mood and Affect: Mood normal.     ?   Behavior: Behavior normal.  ? ? ?ED Results / Procedures / Treatments   ?Labs ?(all labs ordered are listed, but only abnormal results are displayed) ?Labs Reviewed - No data to display ? ?EKG ?None ? ?Radiology ?DG Ankle Complete Right ? ?Result Date: 10/10/2021 ?CLINICAL DATA:  Ankle pain and swelling EXAM: RIGHT ANKLE - COMPLETE 3+ VIEW COMPARISON:  None. FINDINGS: Mild diffuse ankle soft tissue swelling. No fracture or dislocation. No radiopaque foreign body. IMPRESSION: Mild diffuse soft tissue swelling of the ankle without underlying osseous abnormality. Electronically Signed   By: Miachel Roux M.D.   On: 10/10/2021 14:17  ? ?US Venous Img Lower Unilateral  Right ? ?Result Date: 10/10/2021 ?CLINICAL DATA:  Right ankle pain and swelling EXAM: Right LOWER EXTREMITY VENOUS DOPPLER ULTRASOUND TECHNIQUE: Iveth Heidemann-scale sonography with compression, as well as color and duplex ultrasound, were performed to evaluate the deep venous system(s) from the level of the common femoral vein through the popliteal and proximal calf veins. COMPARISON:  None. FINDINGS: VENOUS Normal compressibility of the common femoral, superficial femoral, and popliteal veins, as well as the visualized calf veins. Visualized  portions of profunda femoral vein and great saphenous vein unremarkable. No filling defects to suggest DVT on grayscale or color Doppler imaging. Doppler waveforms show normal direction of venous flow, normal respiratory plasticity and response to augmentation. Limited views of the contralateral common femoral vein are unremarkable. OTHER None. Limitations: none IMPRESSION: Negative. Electronically Signed   By: Franchot Gallo M.D.   On: 10/10/2021 14:40   ? ?Procedures ?Procedures  ? ? ?Medications Ordered in ED ?Medications - No data to display ? ?ED Course/ Medical Decision Making/ A&P ?  ?                        ?Medical Decision Making ?Amount and/or Complexity of Data Reviewed ?Radiology: ordered. ? ? ?Initial Impression and Ddx ?This patient presents to the Emergency Department for the above complaint. This involves an extensive number of treatment options and is a complaint that carries with it a high risk of complications and morbidity. Vital signs were reviewed.  ? ?Serious etiologies considered. Ddx includes but is not limited to: msk, sprain, low suspicion for septic joint, cellulitis ? ?Social determinants of health include   ?Social Determinants of Health with Concerns  ? ?Financial Resource Strain: Not on file  ?Food Insecurity: Not on file  ?Transportation Needs: Not on file  ?Physical Activity: Not on file  ?Stress: Not on file  ?Social Connections: Not on file  ?Intimate  Partner Violence: Not on file  ?Depression (PHQ2-9): Not on file  ?Alcohol Screen: Not on file  ?Housing: Not on file  ?  ?Social Connections: Not on file  ?   ? ?Additional history obtained from daughter ? ?Previous re

## 2021-10-10 NOTE — ED Triage Notes (Signed)
Pt c/o right lower extremity swelling and pain since Saturday. Denies known injury. States ankle hurts worse to move ?

## 2021-10-10 NOTE — Discharge Instructions (Addendum)
It was a pleasure caring for you today in the emergency department. ° °Please return to the emergency department for any worsening or worrisome symptoms. ° ° °

## 2021-11-14 DIAGNOSIS — R197 Diarrhea, unspecified: Secondary | ICD-10-CM | POA: Diagnosis not present

## 2021-11-14 DIAGNOSIS — R0981 Nasal congestion: Secondary | ICD-10-CM | POA: Diagnosis not present

## 2021-11-14 DIAGNOSIS — R059 Cough, unspecified: Secondary | ICD-10-CM | POA: Diagnosis not present

## 2021-11-14 DIAGNOSIS — B349 Viral infection, unspecified: Secondary | ICD-10-CM | POA: Diagnosis not present

## 2021-11-14 DIAGNOSIS — I10 Essential (primary) hypertension: Secondary | ICD-10-CM | POA: Diagnosis not present

## 2021-11-14 DIAGNOSIS — R42 Dizziness and giddiness: Secondary | ICD-10-CM | POA: Diagnosis not present

## 2021-11-23 DIAGNOSIS — R051 Acute cough: Secondary | ICD-10-CM | POA: Diagnosis not present

## 2021-11-23 DIAGNOSIS — M533 Sacrococcygeal disorders, not elsewhere classified: Secondary | ICD-10-CM | POA: Diagnosis not present

## 2021-11-23 DIAGNOSIS — M109 Gout, unspecified: Secondary | ICD-10-CM | POA: Diagnosis not present

## 2021-11-23 DIAGNOSIS — J069 Acute upper respiratory infection, unspecified: Secondary | ICD-10-CM | POA: Diagnosis not present

## 2021-11-23 DIAGNOSIS — E1122 Type 2 diabetes mellitus with diabetic chronic kidney disease: Secondary | ICD-10-CM | POA: Diagnosis not present

## 2021-11-23 DIAGNOSIS — R42 Dizziness and giddiness: Secondary | ICD-10-CM | POA: Diagnosis not present

## 2021-12-18 DIAGNOSIS — L570 Actinic keratosis: Secondary | ICD-10-CM | POA: Diagnosis not present

## 2021-12-18 DIAGNOSIS — L820 Inflamed seborrheic keratosis: Secondary | ICD-10-CM | POA: Diagnosis not present

## 2022-02-26 DIAGNOSIS — L570 Actinic keratosis: Secondary | ICD-10-CM | POA: Diagnosis not present

## 2022-04-24 DIAGNOSIS — M109 Gout, unspecified: Secondary | ICD-10-CM | POA: Diagnosis not present

## 2022-04-24 DIAGNOSIS — E1122 Type 2 diabetes mellitus with diabetic chronic kidney disease: Secondary | ICD-10-CM | POA: Diagnosis not present

## 2022-04-24 DIAGNOSIS — D509 Iron deficiency anemia, unspecified: Secondary | ICD-10-CM | POA: Diagnosis not present

## 2022-04-24 DIAGNOSIS — E785 Hyperlipidemia, unspecified: Secondary | ICD-10-CM | POA: Diagnosis not present

## 2022-04-24 DIAGNOSIS — R7989 Other specified abnormal findings of blood chemistry: Secondary | ICD-10-CM | POA: Diagnosis not present

## 2022-04-24 DIAGNOSIS — D649 Anemia, unspecified: Secondary | ICD-10-CM | POA: Diagnosis not present

## 2022-04-24 DIAGNOSIS — E559 Vitamin D deficiency, unspecified: Secondary | ICD-10-CM | POA: Diagnosis not present

## 2022-05-01 DIAGNOSIS — R42 Dizziness and giddiness: Secondary | ICD-10-CM | POA: Diagnosis not present

## 2022-05-01 DIAGNOSIS — I129 Hypertensive chronic kidney disease with stage 1 through stage 4 chronic kidney disease, or unspecified chronic kidney disease: Secondary | ICD-10-CM | POA: Diagnosis not present

## 2022-05-01 DIAGNOSIS — Z1331 Encounter for screening for depression: Secondary | ICD-10-CM | POA: Diagnosis not present

## 2022-05-01 DIAGNOSIS — R82998 Other abnormal findings in urine: Secondary | ICD-10-CM | POA: Diagnosis not present

## 2022-05-01 DIAGNOSIS — Z0000 Encounter for general adult medical examination without abnormal findings: Secondary | ICD-10-CM | POA: Diagnosis not present

## 2022-05-01 DIAGNOSIS — Z23 Encounter for immunization: Secondary | ICD-10-CM | POA: Diagnosis not present

## 2022-05-01 DIAGNOSIS — N184 Chronic kidney disease, stage 4 (severe): Secondary | ICD-10-CM | POA: Diagnosis not present

## 2022-05-01 DIAGNOSIS — M533 Sacrococcygeal disorders, not elsewhere classified: Secondary | ICD-10-CM | POA: Diagnosis not present

## 2022-05-01 DIAGNOSIS — Z1339 Encounter for screening examination for other mental health and behavioral disorders: Secondary | ICD-10-CM | POA: Diagnosis not present

## 2022-05-01 DIAGNOSIS — Z1231 Encounter for screening mammogram for malignant neoplasm of breast: Secondary | ICD-10-CM | POA: Diagnosis not present

## 2022-05-01 DIAGNOSIS — I700 Atherosclerosis of aorta: Secondary | ICD-10-CM | POA: Diagnosis not present

## 2022-05-01 DIAGNOSIS — M109 Gout, unspecified: Secondary | ICD-10-CM | POA: Diagnosis not present

## 2022-05-01 DIAGNOSIS — E1122 Type 2 diabetes mellitus with diabetic chronic kidney disease: Secondary | ICD-10-CM | POA: Diagnosis not present

## 2022-05-01 DIAGNOSIS — E785 Hyperlipidemia, unspecified: Secondary | ICD-10-CM | POA: Diagnosis not present

## 2022-05-10 DIAGNOSIS — R928 Other abnormal and inconclusive findings on diagnostic imaging of breast: Secondary | ICD-10-CM | POA: Diagnosis not present

## 2022-05-10 DIAGNOSIS — R922 Inconclusive mammogram: Secondary | ICD-10-CM | POA: Diagnosis not present

## 2022-05-10 DIAGNOSIS — R921 Mammographic calcification found on diagnostic imaging of breast: Secondary | ICD-10-CM | POA: Diagnosis not present

## 2022-06-11 DIAGNOSIS — L814 Other melanin hyperpigmentation: Secondary | ICD-10-CM | POA: Diagnosis not present

## 2022-06-11 DIAGNOSIS — Z85828 Personal history of other malignant neoplasm of skin: Secondary | ICD-10-CM | POA: Diagnosis not present

## 2022-06-11 DIAGNOSIS — D485 Neoplasm of uncertain behavior of skin: Secondary | ICD-10-CM | POA: Diagnosis not present

## 2022-06-11 DIAGNOSIS — L570 Actinic keratosis: Secondary | ICD-10-CM | POA: Diagnosis not present

## 2022-06-11 DIAGNOSIS — L821 Other seborrheic keratosis: Secondary | ICD-10-CM | POA: Diagnosis not present

## 2022-06-11 DIAGNOSIS — C44622 Squamous cell carcinoma of skin of right upper limb, including shoulder: Secondary | ICD-10-CM | POA: Diagnosis not present

## 2022-06-11 DIAGNOSIS — L578 Other skin changes due to chronic exposure to nonionizing radiation: Secondary | ICD-10-CM | POA: Diagnosis not present

## 2022-06-11 DIAGNOSIS — D229 Melanocytic nevi, unspecified: Secondary | ICD-10-CM | POA: Diagnosis not present

## 2022-07-16 DIAGNOSIS — C4492 Squamous cell carcinoma of skin, unspecified: Secondary | ICD-10-CM | POA: Diagnosis not present

## 2022-07-16 DIAGNOSIS — L570 Actinic keratosis: Secondary | ICD-10-CM | POA: Diagnosis not present

## 2022-07-16 DIAGNOSIS — C44622 Squamous cell carcinoma of skin of right upper limb, including shoulder: Secondary | ICD-10-CM | POA: Diagnosis not present

## 2022-07-16 DIAGNOSIS — L905 Scar conditions and fibrosis of skin: Secondary | ICD-10-CM | POA: Diagnosis not present

## 2022-07-16 DIAGNOSIS — L821 Other seborrheic keratosis: Secondary | ICD-10-CM | POA: Diagnosis not present

## 2022-08-15 DIAGNOSIS — Z5189 Encounter for other specified aftercare: Secondary | ICD-10-CM | POA: Diagnosis not present

## 2022-08-15 DIAGNOSIS — Z85828 Personal history of other malignant neoplasm of skin: Secondary | ICD-10-CM | POA: Diagnosis not present

## 2022-12-03 DIAGNOSIS — I129 Hypertensive chronic kidney disease with stage 1 through stage 4 chronic kidney disease, or unspecified chronic kidney disease: Secondary | ICD-10-CM | POA: Diagnosis not present

## 2022-12-03 DIAGNOSIS — M1611 Unilateral primary osteoarthritis, right hip: Secondary | ICD-10-CM | POA: Diagnosis not present

## 2022-12-03 DIAGNOSIS — M109 Gout, unspecified: Secondary | ICD-10-CM | POA: Diagnosis not present

## 2022-12-03 DIAGNOSIS — D692 Other nonthrombocytopenic purpura: Secondary | ICD-10-CM | POA: Diagnosis not present

## 2022-12-03 DIAGNOSIS — E559 Vitamin D deficiency, unspecified: Secondary | ICD-10-CM | POA: Diagnosis not present

## 2022-12-03 DIAGNOSIS — M316 Other giant cell arteritis: Secondary | ICD-10-CM | POA: Diagnosis not present

## 2022-12-03 DIAGNOSIS — N184 Chronic kidney disease, stage 4 (severe): Secondary | ICD-10-CM | POA: Diagnosis not present

## 2022-12-03 DIAGNOSIS — D509 Iron deficiency anemia, unspecified: Secondary | ICD-10-CM | POA: Diagnosis not present

## 2022-12-03 DIAGNOSIS — I700 Atherosclerosis of aorta: Secondary | ICD-10-CM | POA: Diagnosis not present

## 2022-12-03 DIAGNOSIS — E1122 Type 2 diabetes mellitus with diabetic chronic kidney disease: Secondary | ICD-10-CM | POA: Diagnosis not present

## 2022-12-03 DIAGNOSIS — E785 Hyperlipidemia, unspecified: Secondary | ICD-10-CM | POA: Diagnosis not present

## 2022-12-03 DIAGNOSIS — R928 Other abnormal and inconclusive findings on diagnostic imaging of breast: Secondary | ICD-10-CM | POA: Diagnosis not present

## 2022-12-03 DIAGNOSIS — I872 Venous insufficiency (chronic) (peripheral): Secondary | ICD-10-CM | POA: Diagnosis not present

## 2022-12-17 DIAGNOSIS — H52223 Regular astigmatism, bilateral: Secondary | ICD-10-CM | POA: Diagnosis not present

## 2022-12-17 DIAGNOSIS — H524 Presbyopia: Secondary | ICD-10-CM | POA: Diagnosis not present

## 2022-12-17 DIAGNOSIS — H5203 Hypermetropia, bilateral: Secondary | ICD-10-CM | POA: Diagnosis not present

## 2023-04-30 DIAGNOSIS — E785 Hyperlipidemia, unspecified: Secondary | ICD-10-CM | POA: Diagnosis not present

## 2023-04-30 DIAGNOSIS — D509 Iron deficiency anemia, unspecified: Secondary | ICD-10-CM | POA: Diagnosis not present

## 2023-04-30 DIAGNOSIS — E559 Vitamin D deficiency, unspecified: Secondary | ICD-10-CM | POA: Diagnosis not present

## 2023-04-30 DIAGNOSIS — M109 Gout, unspecified: Secondary | ICD-10-CM | POA: Diagnosis not present

## 2023-04-30 DIAGNOSIS — E1122 Type 2 diabetes mellitus with diabetic chronic kidney disease: Secondary | ICD-10-CM | POA: Diagnosis not present

## 2023-04-30 DIAGNOSIS — Z1389 Encounter for screening for other disorder: Secondary | ICD-10-CM | POA: Diagnosis not present

## 2023-05-07 DIAGNOSIS — M316 Other giant cell arteritis: Secondary | ICD-10-CM | POA: Diagnosis not present

## 2023-05-07 DIAGNOSIS — E559 Vitamin D deficiency, unspecified: Secondary | ICD-10-CM | POA: Diagnosis not present

## 2023-05-07 DIAGNOSIS — E785 Hyperlipidemia, unspecified: Secondary | ICD-10-CM | POA: Diagnosis not present

## 2023-05-07 DIAGNOSIS — I700 Atherosclerosis of aorta: Secondary | ICD-10-CM | POA: Diagnosis not present

## 2023-05-07 DIAGNOSIS — R42 Dizziness and giddiness: Secondary | ICD-10-CM | POA: Diagnosis not present

## 2023-05-07 DIAGNOSIS — R82998 Other abnormal findings in urine: Secondary | ICD-10-CM | POA: Diagnosis not present

## 2023-05-07 DIAGNOSIS — M545 Low back pain, unspecified: Secondary | ICD-10-CM | POA: Diagnosis not present

## 2023-05-07 DIAGNOSIS — Z1331 Encounter for screening for depression: Secondary | ICD-10-CM | POA: Diagnosis not present

## 2023-05-07 DIAGNOSIS — Z1339 Encounter for screening examination for other mental health and behavioral disorders: Secondary | ICD-10-CM | POA: Diagnosis not present

## 2023-05-07 DIAGNOSIS — E1122 Type 2 diabetes mellitus with diabetic chronic kidney disease: Secondary | ICD-10-CM | POA: Diagnosis not present

## 2023-05-07 DIAGNOSIS — Z23 Encounter for immunization: Secondary | ICD-10-CM | POA: Diagnosis not present

## 2023-05-07 DIAGNOSIS — N184 Chronic kidney disease, stage 4 (severe): Secondary | ICD-10-CM | POA: Diagnosis not present

## 2023-05-07 DIAGNOSIS — Z0000 Encounter for general adult medical examination without abnormal findings: Secondary | ICD-10-CM | POA: Diagnosis not present

## 2023-05-07 DIAGNOSIS — M1611 Unilateral primary osteoarthritis, right hip: Secondary | ICD-10-CM | POA: Diagnosis not present

## 2023-05-07 DIAGNOSIS — I129 Hypertensive chronic kidney disease with stage 1 through stage 4 chronic kidney disease, or unspecified chronic kidney disease: Secondary | ICD-10-CM | POA: Diagnosis not present

## 2023-06-05 DIAGNOSIS — N6314 Unspecified lump in the right breast, lower inner quadrant: Secondary | ICD-10-CM | POA: Diagnosis not present

## 2023-07-02 DIAGNOSIS — R69 Illness, unspecified: Secondary | ICD-10-CM | POA: Diagnosis not present

## 2023-08-12 ENCOUNTER — Ambulatory Visit (INDEPENDENT_AMBULATORY_CARE_PROVIDER_SITE_OTHER): Payer: Medicare HMO | Admitting: Podiatry

## 2023-08-12 ENCOUNTER — Encounter: Payer: Self-pay | Admitting: Podiatry

## 2023-08-12 DIAGNOSIS — M79674 Pain in right toe(s): Secondary | ICD-10-CM

## 2023-08-12 DIAGNOSIS — M79675 Pain in left toe(s): Secondary | ICD-10-CM

## 2023-08-12 DIAGNOSIS — B351 Tinea unguium: Secondary | ICD-10-CM | POA: Diagnosis not present

## 2023-08-12 DIAGNOSIS — L600 Ingrowing nail: Secondary | ICD-10-CM

## 2023-08-12 NOTE — Progress Notes (Signed)
 Subjective:   Patient ID: Vanessa Geraldine Kling, female   DOB: 89 y.o.   MRN: [id number]   HPI Chief Complaint  Patient presents with   Ingrown Toenail    RM#11 left foot ingrown toenail big toe /nail trim.   89 year old female presents the office today for concerns of her nails becoming thick and elongated she cannot trim herself.  She states her left big toenail hurts when she wear shoes as it rubs.  Denies any swelling redness or any drainage.  She has had one of the toenails removed previously but she is not sure which nail was removed.  She has not been proceed with nail removal again.   Review of Systems  All other systems reviewed and are negative.  Past Medical History:  Diagnosis Date   Hypertension    Vertigo     Past Surgical History:  Procedure Laterality Date   ABDOMINAL HYSTERECTOMY     TONSILLECTOMY       Current Outpatient Medications:    acetaminophen  (TYLENOL ) 325 MG tablet, Take 2 tablets (650 mg total) by mouth every 6 (six) hours as needed., Disp: 36 tablet, Rfl: 0   acetaminophen  (TYLENOL ) 500 MG tablet, Take 500 mg by mouth every 6 (six) hours as needed., Disp: , Rfl:    allopurinol  (ZYLOPRIM ) 300 MG tablet, Take 300 mg by mouth daily., Disp: , Rfl:    aspirin 81 MG EC tablet, Take 81 mg by mouth daily., Disp: , Rfl:    atenolol  (TENORMIN ) 50 MG tablet, Take 50 mg by mouth daily., Disp: , Rfl:    colchicine 0.6 MG tablet, Take 0.6 mg by mouth daily as needed (for gout flare). , Disp: , Rfl:    cyclobenzaprine (FLEXERIL) 5 MG tablet, Take 5 mg by mouth 3 (three) times daily as needed for muscle spasms., Disp: , Rfl:    ferrous sulfate  325 (65 FE) MG EC tablet, Take 325 mg by mouth daily with breakfast., Disp: , Rfl:    ibuprofen  (ADVIL ) 600 MG tablet, Take 1 tablet (600 mg total) by mouth every 6 (six) hours as needed., Disp: 30 tablet, Rfl: 0   meclizine  (ANTIVERT ) 12.5 MG tablet, Take 12.5 mg by mouth 3 (three) times daily as needed for dizziness., Disp: , Rfl:     Menthol, Topical Analgesic, (BIOFREEZE ROLL-ON EX), Apply topically., Disp: , Rfl:    Omega-3 Fatty Acids (FISH OIL) 1200 MG CAPS, Take 1 capsule by mouth daily at 6 (six) AM., Disp: , Rfl:    pantoprazole  (PROTONIX ) 40 MG tablet, Take 1 tablet (40 mg total) by mouth daily., Disp: 30 tablet, Rfl: 0   triamterene -hydrochlorothiazide  (MAXZIDE -25) 37.5-25 MG tablet, Take 1 tablet by mouth daily., Disp: , Rfl:   Allergies  Allergen Reactions   Codeine Nausea And Vomiting   Morphine Nausea And Vomiting          Objective:  Physical Exam  General: AAO x3, NAD  Dermatological: Nails are hypertrophic, dystrophic, brittle, discolored, elongated 10. No surrounding redness or drainage. Tenderness nails 1-5 bilaterally.  There is monitor patient the left hallux toenail distally but I think more of her symptoms are coming with the nailbed along and is rubbing.  No open lesions or pre-ulcerative lesions are identified today.  Vascular: Dorsalis Pedis artery and Posterior Tibial artery pedal pulses are palpable bilateral with immedate capillary fill time.  There is no pain with calf compression, swelling, warmth, erythema.   Neruologic: Grossly intact via light touch bilateral.   Musculoskeletal:  Send in the nails no other areas of discomfort. Gait: Unassisted, Nonantalgic.       Assessment:   Symptomatic onychomycosis; ingrown toenail     Plan:  -Treatment options discussed including all alternatives, risks, and complications -Etiology of symptoms were discussed -Nails debrided 10 without complications or bleeding.  In particular was able to debride the left hallux toenail.  After debrided there was no significant incurvation present.  She does not want to proceed with nail removal. -Daily foot inspection -Follow-up in 3 months or sooner if any problems arise. In the meantime, encouraged to call the office with any questions, concerns, change in symptoms.   Bobbie Burows, DPM

## 2023-09-30 DIAGNOSIS — R0600 Dyspnea, unspecified: Secondary | ICD-10-CM | POA: Diagnosis not present

## 2023-09-30 DIAGNOSIS — R54 Age-related physical debility: Secondary | ICD-10-CM | POA: Diagnosis not present

## 2023-09-30 DIAGNOSIS — D509 Iron deficiency anemia, unspecified: Secondary | ICD-10-CM | POA: Diagnosis not present

## 2023-09-30 DIAGNOSIS — I129 Hypertensive chronic kidney disease with stage 1 through stage 4 chronic kidney disease, or unspecified chronic kidney disease: Secondary | ICD-10-CM | POA: Diagnosis not present

## 2023-11-21 DIAGNOSIS — E1122 Type 2 diabetes mellitus with diabetic chronic kidney disease: Secondary | ICD-10-CM | POA: Diagnosis not present

## 2023-11-21 DIAGNOSIS — N184 Chronic kidney disease, stage 4 (severe): Secondary | ICD-10-CM | POA: Diagnosis not present

## 2023-11-21 DIAGNOSIS — I129 Hypertensive chronic kidney disease with stage 1 through stage 4 chronic kidney disease, or unspecified chronic kidney disease: Secondary | ICD-10-CM | POA: Diagnosis not present

## 2023-11-21 DIAGNOSIS — M316 Other giant cell arteritis: Secondary | ICD-10-CM | POA: Diagnosis not present

## 2023-11-22 ENCOUNTER — Ambulatory Visit: Payer: Medicare HMO | Admitting: Podiatry

## 2024-02-29 ENCOUNTER — Observation Stay (HOSPITAL_COMMUNITY)

## 2024-02-29 ENCOUNTER — Emergency Department (HOSPITAL_COMMUNITY)

## 2024-02-29 ENCOUNTER — Inpatient Hospital Stay (HOSPITAL_COMMUNITY)
Admission: EM | Admit: 2024-02-29 | Discharge: 2024-03-04 | DRG: 064 | Disposition: A | Discharge status: Hospice | Attending: Internal Medicine | Admitting: Internal Medicine

## 2024-02-29 DIAGNOSIS — I639 Cerebral infarction, unspecified: Secondary | ICD-10-CM | POA: Diagnosis present

## 2024-02-29 DIAGNOSIS — D72829 Elevated white blood cell count, unspecified: Secondary | ICD-10-CM | POA: Diagnosis present

## 2024-02-29 DIAGNOSIS — Z79899 Other long term (current) drug therapy: Secondary | ICD-10-CM

## 2024-02-29 DIAGNOSIS — R233 Spontaneous ecchymoses: Secondary | ICD-10-CM

## 2024-02-29 DIAGNOSIS — N184 Chronic kidney disease, stage 4 (severe): Secondary | ICD-10-CM | POA: Diagnosis present

## 2024-02-29 DIAGNOSIS — E119 Type 2 diabetes mellitus without complications: Secondary | ICD-10-CM

## 2024-02-29 DIAGNOSIS — Z66 Do not resuscitate: Secondary | ICD-10-CM | POA: Diagnosis present

## 2024-02-29 DIAGNOSIS — Z7982 Long term (current) use of aspirin: Secondary | ICD-10-CM

## 2024-02-29 DIAGNOSIS — N179 Acute kidney failure, unspecified: Secondary | ICD-10-CM | POA: Diagnosis present

## 2024-02-29 DIAGNOSIS — R29810 Facial weakness: Secondary | ICD-10-CM | POA: Diagnosis present

## 2024-02-29 DIAGNOSIS — R930 Abnormal findings on diagnostic imaging of skull and head, not elsewhere classified: Secondary | ICD-10-CM | POA: Diagnosis not present

## 2024-02-29 DIAGNOSIS — E1122 Type 2 diabetes mellitus with diabetic chronic kidney disease: Secondary | ICD-10-CM | POA: Diagnosis present

## 2024-02-29 DIAGNOSIS — I619 Nontraumatic intracerebral hemorrhage, unspecified: Secondary | ICD-10-CM | POA: Diagnosis present

## 2024-02-29 DIAGNOSIS — G936 Cerebral edema: Secondary | ICD-10-CM | POA: Diagnosis present

## 2024-02-29 DIAGNOSIS — R531 Weakness: Secondary | ICD-10-CM | POA: Diagnosis not present

## 2024-02-29 DIAGNOSIS — R4701 Aphasia: Secondary | ICD-10-CM | POA: Diagnosis present

## 2024-02-29 DIAGNOSIS — E1165 Type 2 diabetes mellitus with hyperglycemia: Secondary | ICD-10-CM | POA: Diagnosis present

## 2024-02-29 DIAGNOSIS — G8191 Hemiplegia, unspecified affecting right dominant side: Secondary | ICD-10-CM | POA: Diagnosis present

## 2024-02-29 DIAGNOSIS — Z6830 Body mass index (BMI) 30.0-30.9, adult: Secondary | ICD-10-CM

## 2024-02-29 DIAGNOSIS — Z90710 Acquired absence of both cervix and uterus: Secondary | ICD-10-CM

## 2024-02-29 DIAGNOSIS — R29723 NIHSS score 23: Secondary | ICD-10-CM | POA: Diagnosis present

## 2024-02-29 DIAGNOSIS — I10 Essential (primary) hypertension: Secondary | ICD-10-CM | POA: Diagnosis present

## 2024-02-29 DIAGNOSIS — I63512 Cerebral infarction due to unspecified occlusion or stenosis of left middle cerebral artery: Principal | ICD-10-CM | POA: Diagnosis present

## 2024-02-29 DIAGNOSIS — I129 Hypertensive chronic kidney disease with stage 1 through stage 4 chronic kidney disease, or unspecified chronic kidney disease: Secondary | ICD-10-CM | POA: Diagnosis present

## 2024-02-29 DIAGNOSIS — Z885 Allergy status to narcotic agent status: Secondary | ICD-10-CM

## 2024-02-29 DIAGNOSIS — N183 Chronic kidney disease, stage 3 unspecified: Secondary | ICD-10-CM | POA: Insufficient documentation

## 2024-02-29 DIAGNOSIS — E669 Obesity, unspecified: Secondary | ICD-10-CM | POA: Diagnosis present

## 2024-02-29 DIAGNOSIS — R1310 Dysphagia, unspecified: Secondary | ICD-10-CM | POA: Diagnosis present

## 2024-02-29 DIAGNOSIS — I6501 Occlusion and stenosis of right vertebral artery: Secondary | ICD-10-CM | POA: Diagnosis present

## 2024-02-29 DIAGNOSIS — Z515 Encounter for palliative care: Secondary | ICD-10-CM

## 2024-02-29 DIAGNOSIS — E785 Hyperlipidemia, unspecified: Secondary | ICD-10-CM | POA: Diagnosis present

## 2024-02-29 LAB — PROTIME-INR
INR: 1 (ref 0.8–1.2)
Prothrombin Time: 13.8 s (ref 11.4–15.2)

## 2024-02-29 LAB — I-STAT CHEM 8, ED
BUN: 50 mg/dL — ABNORMAL HIGH (ref 8–23)
Calcium, Ion: 1.19 mmol/L (ref 1.15–1.40)
Chloride: 108 mmol/L (ref 98–111)
Creatinine, Ser: 1.2 mg/dL — ABNORMAL HIGH (ref 0.44–1.00)
Glucose, Bld: 276 mg/dL — ABNORMAL HIGH (ref 70–99)
HCT: 42 % (ref 36.0–46.0)
Hemoglobin: 14.3 g/dL (ref 12.0–15.0)
Potassium: 4.2 mmol/L (ref 3.5–5.1)
Sodium: 141 mmol/L (ref 135–145)
TCO2: 23 mmol/L (ref 22–32)

## 2024-02-29 LAB — HEMOGLOBIN A1C
Hgb A1c MFr Bld: 6.4 % — ABNORMAL HIGH (ref 4.8–5.6)
Mean Plasma Glucose: 136.98 mg/dL

## 2024-02-29 LAB — GLUCOSE, CAPILLARY
Glucose-Capillary: 195 mg/dL — ABNORMAL HIGH (ref 70–99)
Glucose-Capillary: 175 mg/dL — ABNORMAL HIGH (ref 70–99)
Glucose-Capillary: 134 mg/dL — ABNORMAL HIGH (ref 70–99)

## 2024-02-29 LAB — CBC
HCT: 42.1 % (ref 36.0–46.0)
Hemoglobin: 13.3 g/dL (ref 12.0–15.0)
MCH: 27.9 pg (ref 26.0–34.0)
MCHC: 31.6 g/dL (ref 30.0–36.0)
MCV: 88.3 fL (ref 80.0–100.0)
Platelets: 256 K/uL (ref 150–400)
RBC: 4.77 MIL/uL (ref 3.87–5.11)
RDW: 13.2 % (ref 11.5–15.5)
WBC: 15.5 K/uL — ABNORMAL HIGH (ref 4.0–10.5)
nRBC: 0 % (ref 0.0–0.2)

## 2024-02-29 LAB — COMPREHENSIVE METABOLIC PANEL WITH GFR
ALT: 19 U/L (ref 0–44)
AST: 27 U/L (ref 15–41)
Albumin: 3.9 g/dL (ref 3.5–5.0)
Alkaline Phosphatase: 72 U/L (ref 38–126)
Anion gap: 13 (ref 5–15)
BUN: 47 mg/dL — ABNORMAL HIGH (ref 8–23)
CO2: 22 mmol/L (ref 22–32)
Calcium: 9.7 mg/dL (ref 8.9–10.3)
Chloride: 105 mmol/L (ref 98–111)
Creatinine, Ser: 1.37 mg/dL — ABNORMAL HIGH (ref 0.44–1.00)
GFR, Estimated: 35 mL/min — ABNORMAL LOW (ref >60)
Glucose, Bld: 275 mg/dL — ABNORMAL HIGH (ref 70–99)
Potassium: 4.1 mmol/L (ref 3.5–5.1)
Sodium: 140 mmol/L (ref 135–145)
Total Bilirubin: 0.5 mg/dL (ref 0.0–1.2)
Total Protein: 7.7 g/dL (ref 6.5–8.1)

## 2024-02-29 LAB — DIFFERENTIAL
Abs Immature Granulocytes: 0.09 K/uL — ABNORMAL HIGH (ref 0.00–0.07)
Basophils Absolute: 0 K/uL (ref 0.0–0.1)
Basophils Relative: 0 %
Eosinophils Absolute: 0 K/uL (ref 0.0–0.5)
Eosinophils Relative: 0 %
Immature Granulocytes: 1 %
Lymphocytes Relative: 5 %
Lymphs Abs: 0.7 K/uL (ref 0.7–4.0)
Monocytes Absolute: 0.8 K/uL (ref 0.1–1.0)
Monocytes Relative: 5 %
Neutro Abs: 13.9 K/uL — ABNORMAL HIGH (ref 1.7–7.7)
Neutrophils Relative %: 89 %

## 2024-02-29 LAB — ETHANOL: Alcohol, Ethyl (B): <15 mg/dL (ref <15)

## 2024-02-29 LAB — LIPID PANEL
Cholesterol: 191 mg/dL (ref 0–200)
HDL: 62 mg/dL (ref >40)
LDL Cholesterol: 112 mg/dL — ABNORMAL HIGH (ref 0–99)
Total CHOL/HDL Ratio: 3.1 ratio
Triglycerides: 85 mg/dL (ref <150)
VLDL: 17 mg/dL (ref 0–40)

## 2024-02-29 LAB — APTT: aPTT: 24 s (ref 24–36)

## 2024-02-29 IMAGING — US US EXTREM LOW VENOUS*R*
1 series · 14 of 24 positions shown · non-contrast
Comparison: None.

CLINICAL DATA: Right ankle pain and swelling

EXAM:
Right LOWER EXTREMITY VENOUS DOPPLER ULTRASOUND
TECHNIQUE: Gray-scale sonography with compression, as well as color and duplex
ultrasound, were performed to evaluate the deep venous system(s)
from the level of the common femoral vein through the popliteal and
proximal calf veins.

[Series 1: us extrem low venous*right* · 14 of 42 slices shown]
[im 1/42]
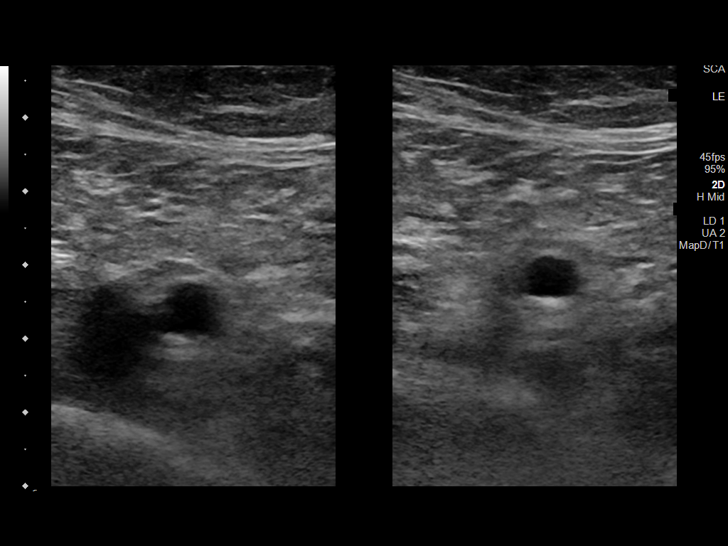
[im 4/42]
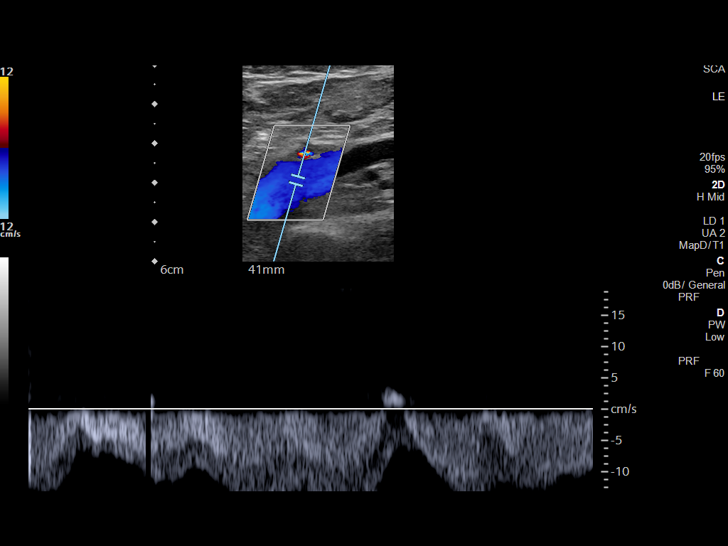
[im 8/42]
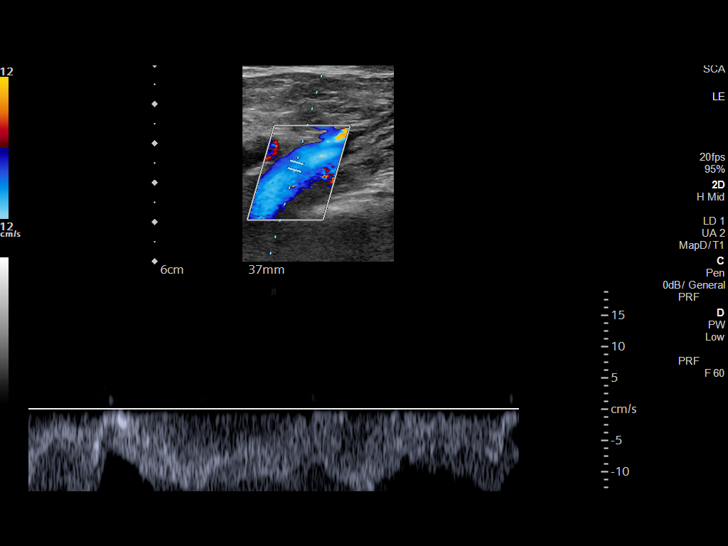
[im 11/42]
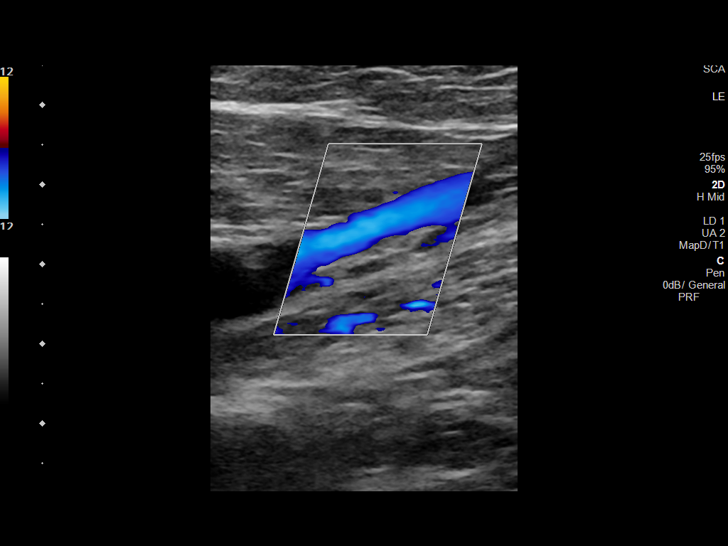
[im 13/42]
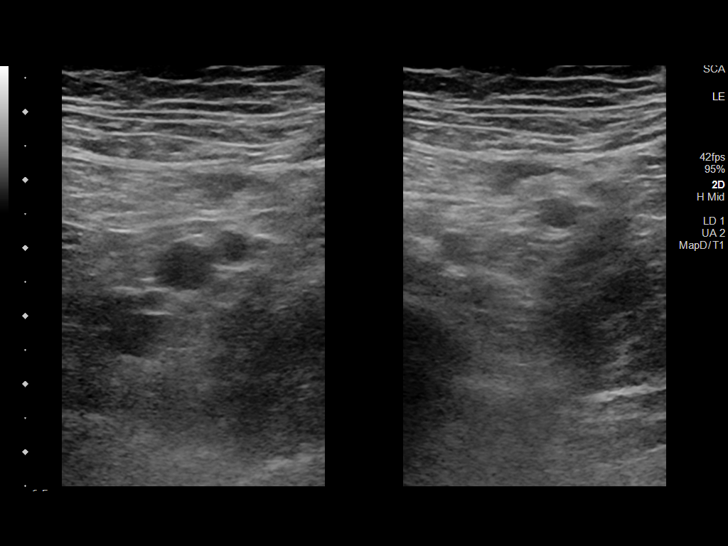
[im 17/42]
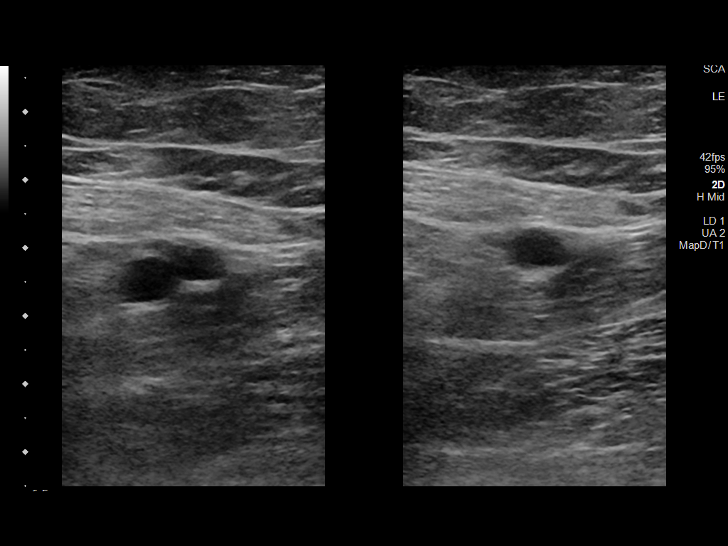
[im 20/42]
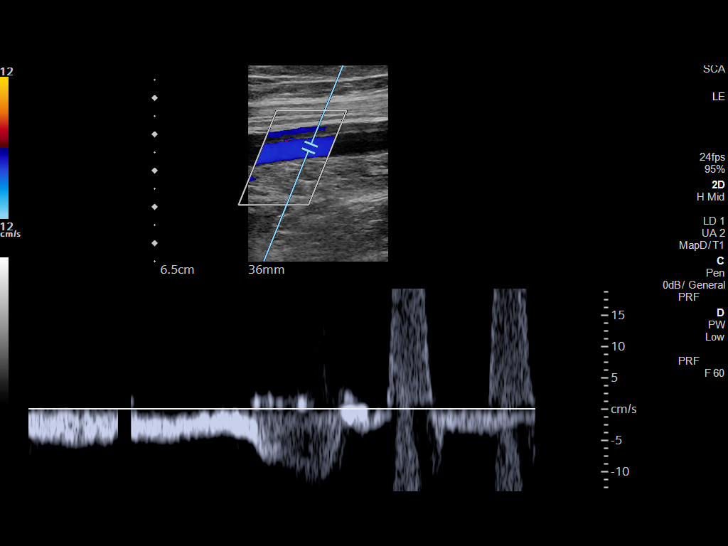
[im 22/42]
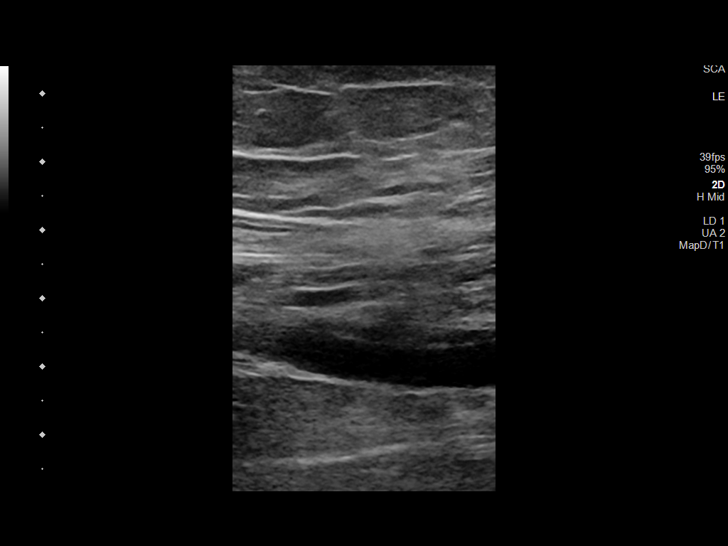
[im 25/42]
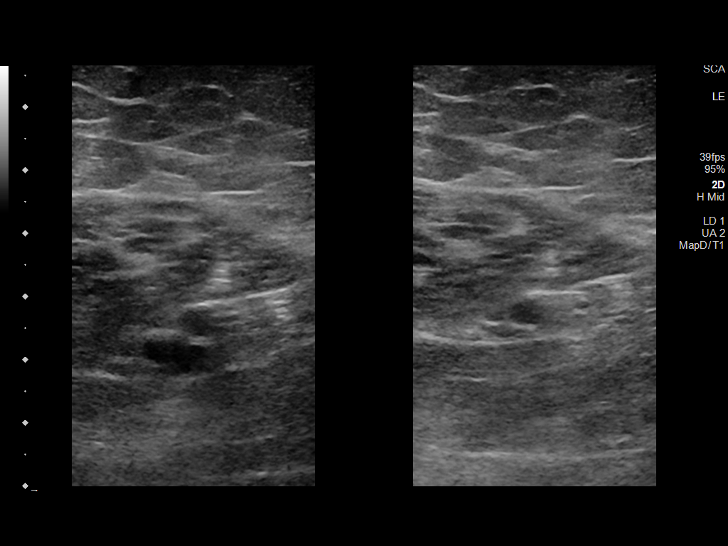
[im 29/42]
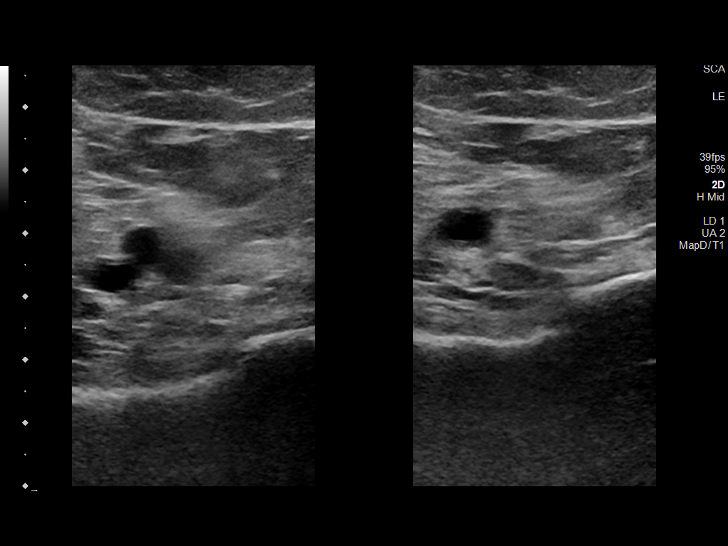
[im 33/42]
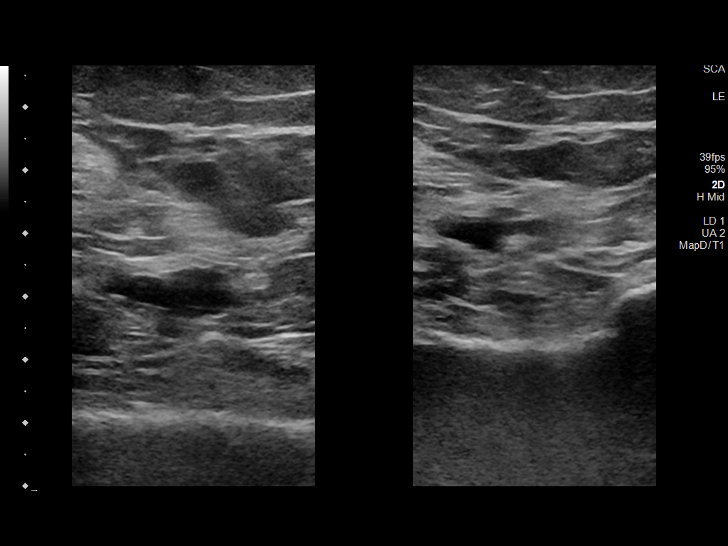
[im 34/42]
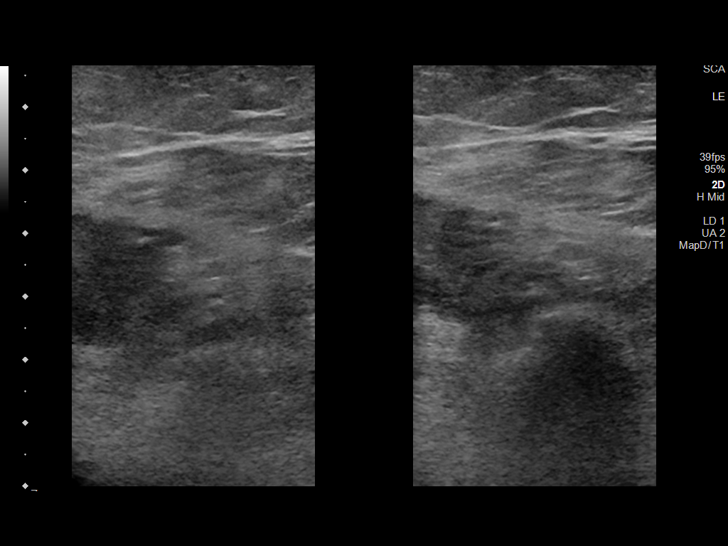
[im 38/42]
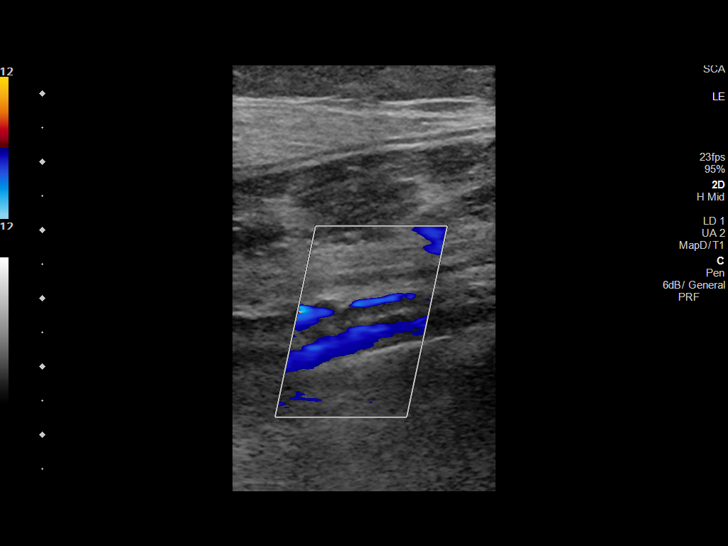
[im 42/42]
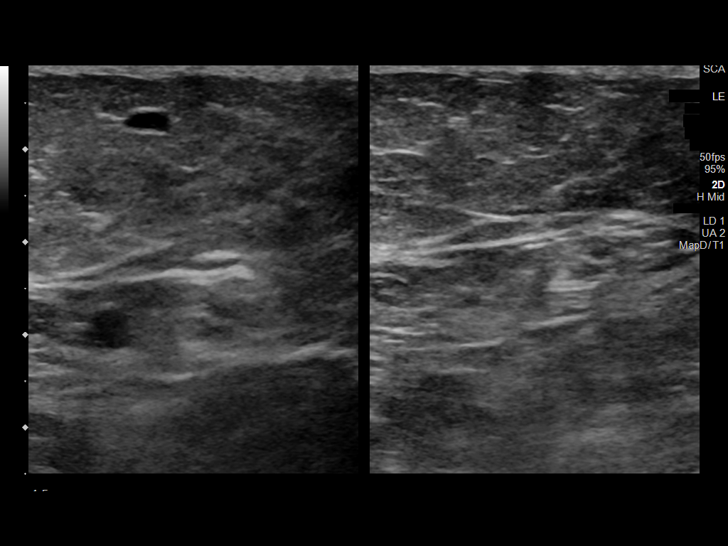

[14 of 24 positions shown; findings below may reference images not displayed]

FINDINGS: VENOUS

Normal compressibility of the common femoral, superficial femoral,
and popliteal veins, as well as the visualized calf veins.
Visualized portions of profunda femoral vein and great saphenous
vein unremarkable. No filling defects to suggest DVT on grayscale or
color Doppler imaging. Doppler waveforms show normal direction of
venous flow, normal respiratory plasticity and response to
augmentation.

Limited views of the contralateral common femoral vein are
unremarkable.

OTHER

None.

Limitations: none
IMPRESSION: Negative.

## 2024-02-29 IMAGING — DX DG ANKLE COMPLETE 3+V*R*
3 series · 3 of 3 positions shown · non-contrast
Comparison: None.

CLINICAL DATA: Ankle pain and swelling

EXAM:
RIGHT ANKLE - COMPLETE 3+ VIEW

[ankle ap]
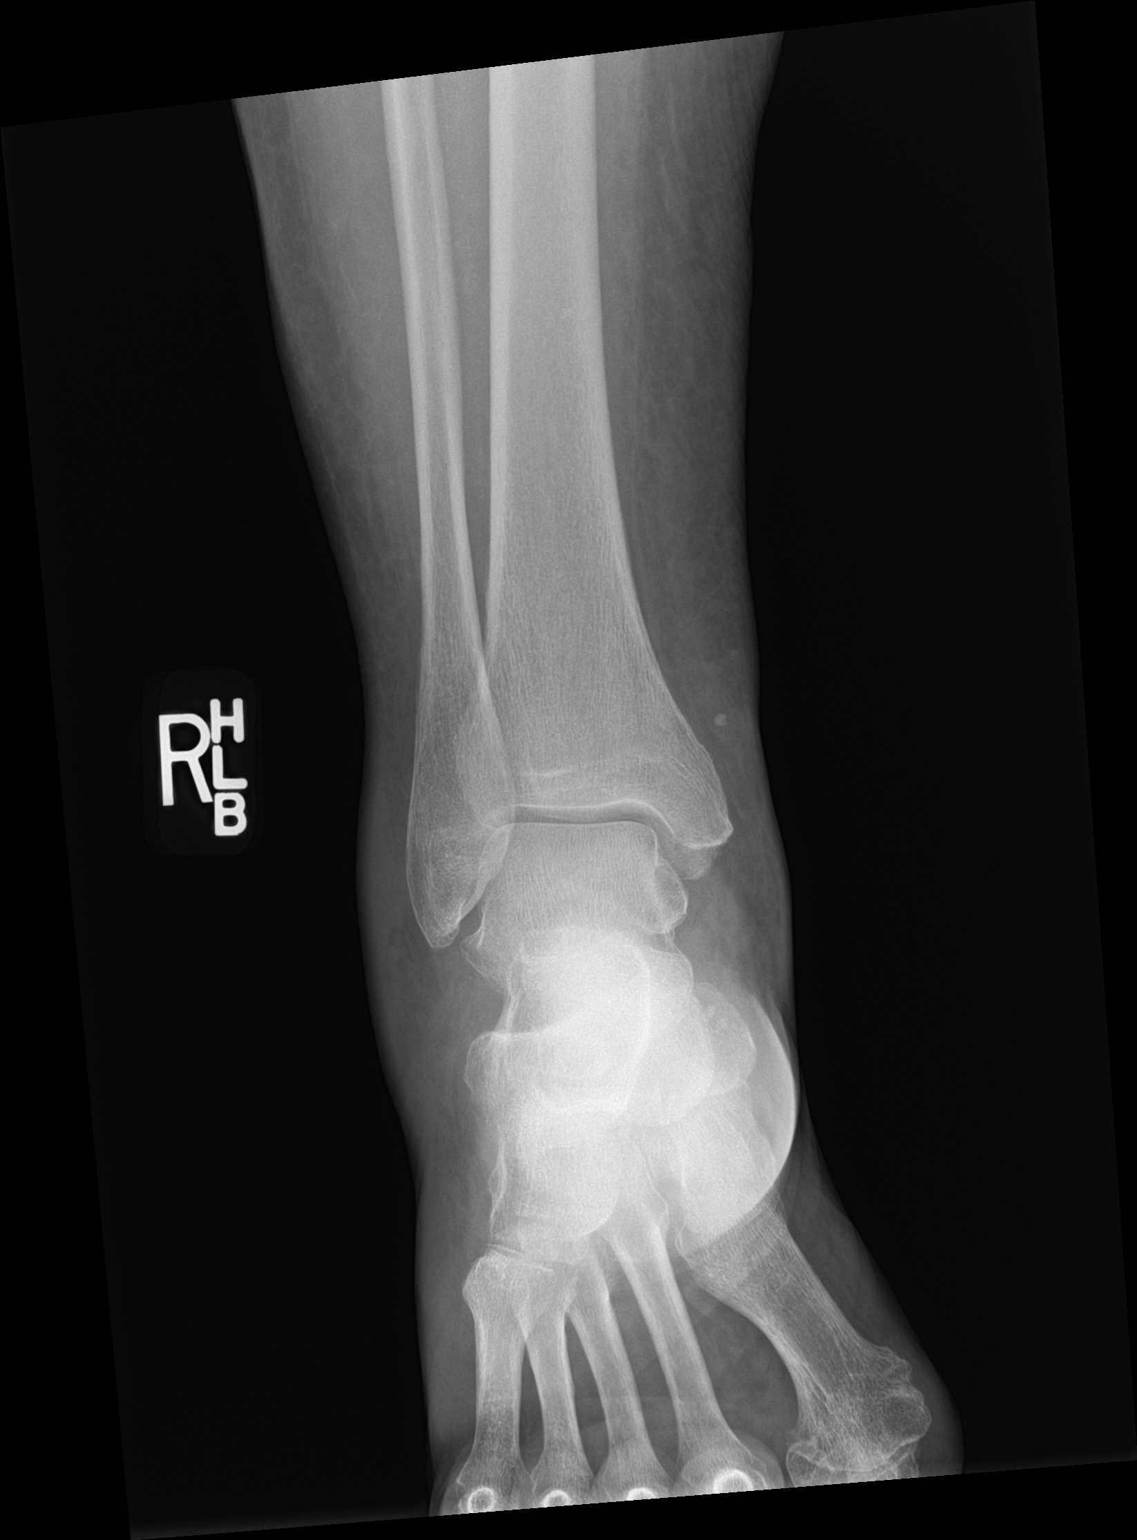

[ankle obl]
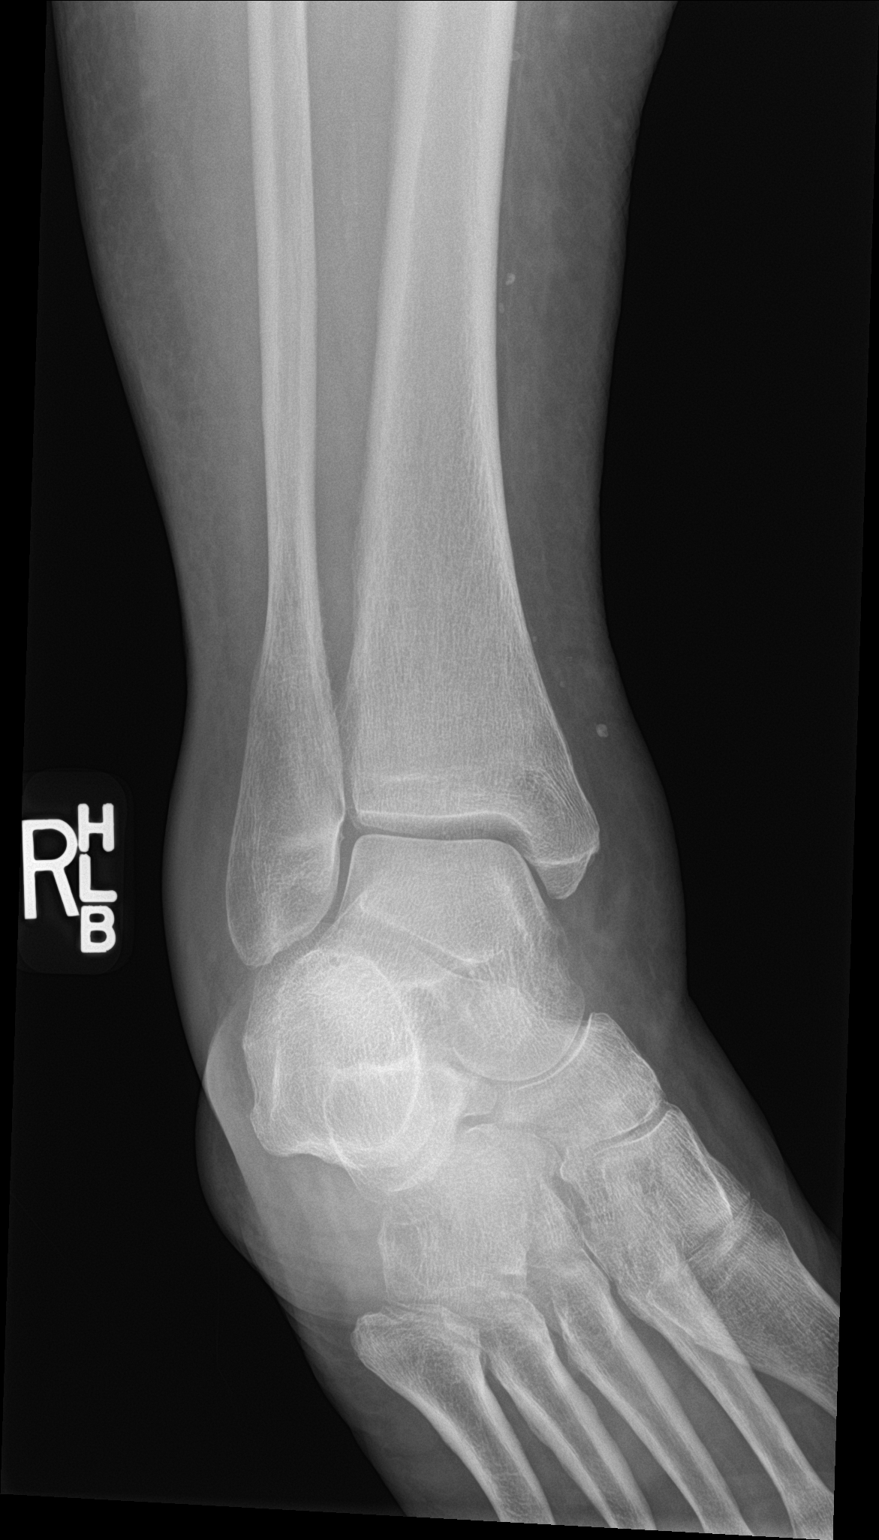

[ankle lat]
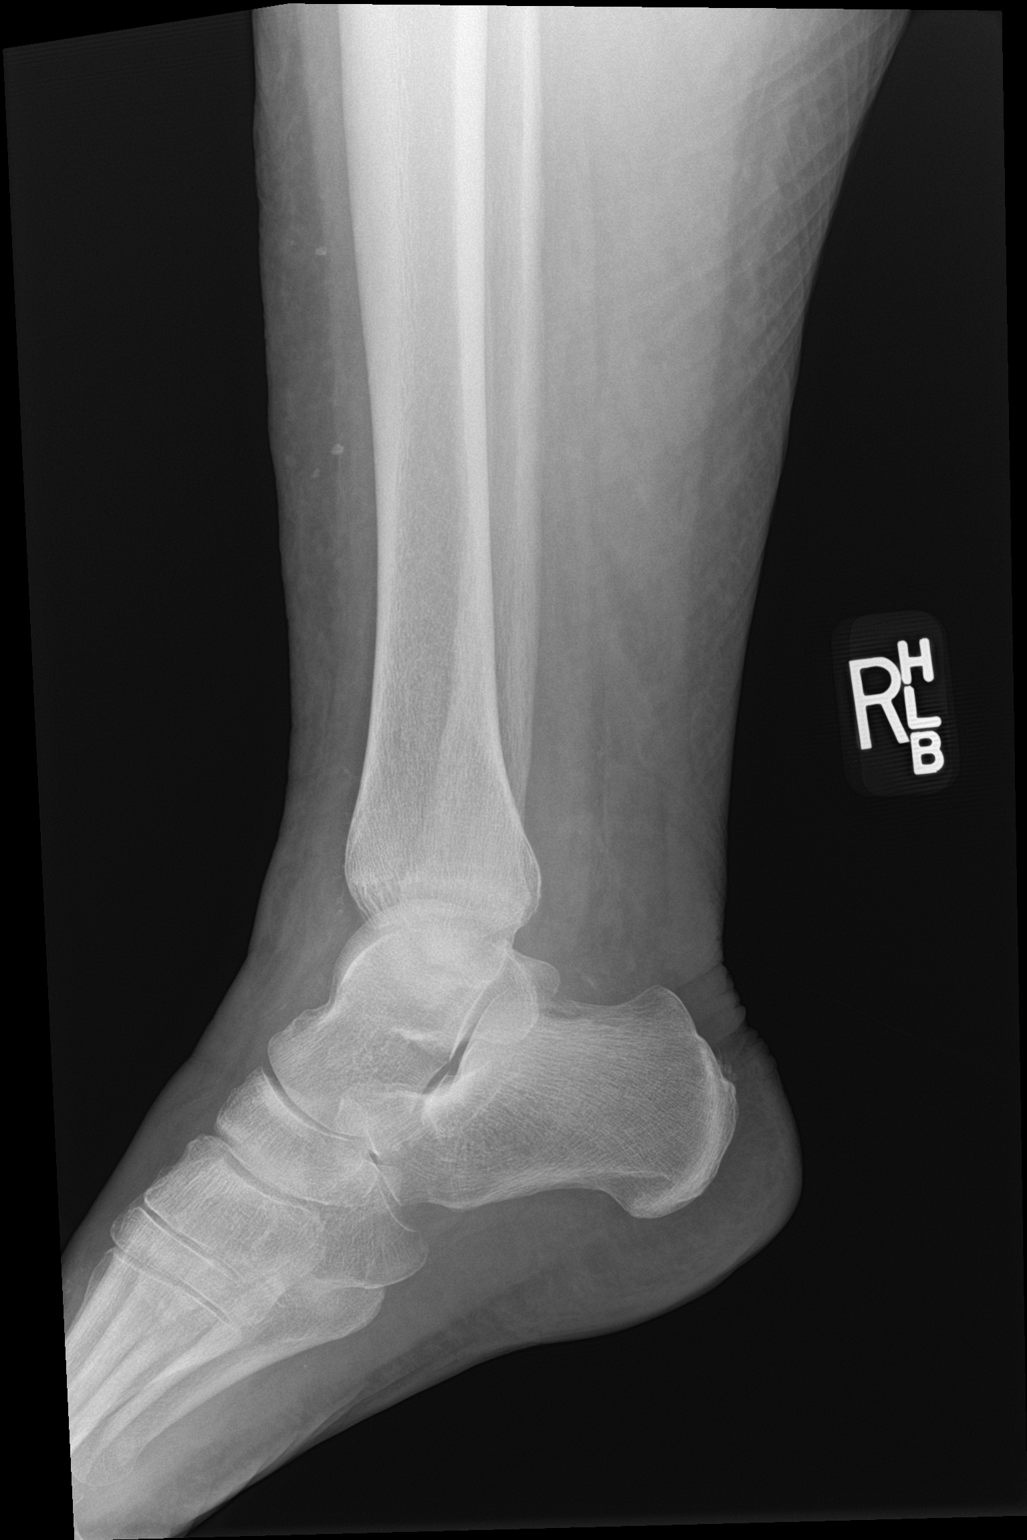

[3 of 3 positions shown; findings below may reference images not displayed]

FINDINGS: Mild diffuse ankle soft tissue swelling. No fracture or dislocation.
No radiopaque foreign body.
IMPRESSION: Mild diffuse soft tissue swelling of the ankle without underlying
osseous abnormality.

## 2024-02-29 MED ORDER — ACETAMINOPHEN 160 MG/5ML PO SOLN: 650.0000 mg | ORAL | Status: DC | PRN

## 2024-02-29 MED ORDER — IOHEXOL 350 MG/ML SOLN
100.0000 mL | Freq: Once | INTRAVENOUS | Status: AC | PRN
  Administered 2024-02-29: 60 mL via INTRAVENOUS

## 2024-02-29 MED ORDER — STROKE: EARLY STAGES OF RECOVERY BOOK
Freq: Once | Status: AC
  Filled 2024-02-29: qty 1

## 2024-02-29 MED ORDER — SCOPOLAMINE 1 MG/3DAYS TD PT72
1.0000 | MEDICATED_PATCH | TRANSDERMAL | Status: DC
  Administered 2024-02-29 – 2024-03-03 (×2): 1 mg via TRANSDERMAL
  Filled 2024-02-29 (×3): qty 1

## 2024-02-29 MED ORDER — SODIUM CHLORIDE 0.9% FLUSH
3.0000 mL | Freq: Once | INTRAVENOUS | Status: AC
  Administered 2024-02-29: 3 mL via INTRAVENOUS

## 2024-02-29 MED ORDER — ACETAMINOPHEN 650 MG RE SUPP
650.0000 mg | RECTAL | Status: DC | PRN
Start: 2024-02-29 — End: 2024-03-04
  Administered 2024-03-03: 650 mg via RECTAL
  Filled 2024-02-29: qty 1

## 2024-02-29 MED ORDER — ACETAMINOPHEN 325 MG PO TABS
650.0000 mg | ORAL_TABLET | ORAL | Status: DC | PRN
Start: 2024-02-29 — End: 2024-03-04

## 2024-02-29 MED ORDER — INSULIN ASPART 100 UNIT/ML IJ SOLN
0.0000 [IU] | INTRAMUSCULAR | Status: DC
  Administered 2024-02-29: 1 [IU] via SUBCUTANEOUS
  Administered 2024-02-29 (×2): 2 [IU] via SUBCUTANEOUS
  Administered 2024-03-01: 1 [IU] via SUBCUTANEOUS

## 2024-02-29 MED ORDER — MORPHINE SULFATE (PF) 2 MG/ML IV SOLN: 1.0000 mg | INTRAVENOUS | Status: DC | PRN

## 2024-02-29 MED ORDER — ASPIRIN 300 MG RE SUPP
300.0000 mg | Freq: Every day | RECTAL | Status: DC
  Administered 2024-02-29 – 2024-03-01 (×2): 300 mg via RECTAL
  Filled 2024-02-29 (×2): qty 1

## 2024-02-29 MED ORDER — ASPIRIN 81 MG PO CHEW
81.0000 mg | CHEWABLE_TABLET | Freq: Every day | ORAL | Status: DC
Start: 2024-02-29 — End: 2024-03-01

## 2024-02-29 MED ORDER — HYDRALAZINE HCL 20 MG/ML IJ SOLN: 5.0000 mg | Freq: Four times a day (QID) | INTRAMUSCULAR | Status: DC | PRN

## 2024-02-29 MED ORDER — SODIUM CHLORIDE 0.9 % IV SOLN: INTRAVENOUS | Status: AC

## 2024-02-29 NOTE — ED Notes (Signed)
Neurology is at bedside at this time.

## 2024-02-29 NOTE — ED Notes (Signed)
 CCMD called.

## 2024-02-29 NOTE — ED Provider Notes (Signed)
 [place] EMERGENCY DEPARTMENT AT [facility] Provider Note   CSN: [id number] Arrival date & time: 02/29/24  9049  An emergency department physician performed an initial assessment on this suspected stroke patient at 19.  Patient presents with: No chief complaint on file.   Kaitlyn Garrison is a 89 y.o. female.   89 year old female with a history of vertigo and hypertension who presents to the emergency department with weakness and difficulty speaking.  Patient was last known well at 6:15 PM last night.  Was seen by family this morning and was not moving her right side and not speaking.  911 was called.  Code stroke activated in the field.  She is not on any blood thinners.  No known history of strokes.       Prior to Admission medications   Medication Sig Start Date End Date Taking? Authorizing Provider  acetaminophen  (TYLENOL ) 325 MG tablet Take 2 tablets (650 mg total) by mouth every 6 (six) hours as needed. 10/10/21   Elnor Jayson LABOR, DO  acetaminophen  (TYLENOL ) 500 MG tablet Take 500 mg by mouth every 6 (six) hours as needed.    [provider]  allopurinol  (ZYLOPRIM ) 300 MG tablet Take 300 mg by mouth daily.    [provider]  aspirin  81 MG EC tablet Take 81 mg by mouth daily.    [provider]  atenolol  (TENORMIN ) 50 MG tablet Take 50 mg by mouth daily.    [provider]  colchicine 0.6 MG tablet Take 0.6 mg by mouth daily as needed (for gout flare).     [provider]  cyclobenzaprine (FLEXERIL) 5 MG tablet Take 5 mg by mouth 3 (three) times daily as needed for muscle spasms.    [provider]  ferrous sulfate  325 (65 FE) MG EC tablet Take 325 mg by mouth daily with breakfast.    [provider]  ibuprofen  (ADVIL ) 600 MG tablet Take 1 tablet (600 mg total) by mouth every 6 (six) hours as needed. 10/10/21   Elnor Jayson LABOR, DO  meclizine  (ANTIVERT ) 12.5 MG tablet Take 12.5 mg by mouth 3 (three) times  daily as needed for dizziness.    [provider]  Menthol, Topical Analgesic, (BIOFREEZE ROLL-ON EX) Apply topically.    [provider]  Omega-3 Fatty Acids (FISH OIL) 1200 MG CAPS Take 1 capsule by mouth daily at 6 (six) AM.    [provider]  pantoprazole  (PROTONIX ) 40 MG tablet Take 1 tablet (40 mg total) by mouth daily. 10/24/17   Cheryle Page, MD  triamterene -hydrochlorothiazide  (MAXZIDE -25) 37.5-25 MG tablet Take 1 tablet by mouth daily.    [provider]    Allergies: Codeine and Morphine     Review of Systems  Updated Vital Signs BP (!) 125/99 (BP Location: Right Arm)   Pulse 79   Temp 98 F (36.7 C) (Oral)   Resp 20   Ht 5' 4 (1.626 m) Comment: estimated from family  Wt 80.9 kg   SpO2 100%   BMI 30.61 kg/m   Physical Exam Vitals and nursing note reviewed.  Constitutional:      General: She is in acute distress.     Appearance: She is well-developed. She is ill-appearing.     Comments: Responds to voice but is nonverbal  HENT:     Head: Normocephalic and atraumatic.     Right Ear: External ear normal.     Left Ear: External ear normal.  Nose: Nose normal.  Eyes:     Extraocular Movements: Extraocular movements intact.     Conjunctiva/sclera: Conjunctivae normal.     Pupils: Pupils are equal, round, and reactive to light.     Comments: 4 mm bilateral  Cardiovascular:     Rate and Rhythm: Normal rate and regular rhythm.     Heart sounds: No murmur heard. Pulmonary:     Effort: Pulmonary effort is normal. No respiratory distress.     Breath sounds: Normal breath sounds.  Musculoskeletal:     Cervical back: Normal range of motion and neck supple.     Right lower leg: No edema.     Left lower leg: No edema.  Skin:    General: Skin is warm and dry.  Neurological:     Mental Status: She is alert.     Comments: Difficulty following exam due to her aphasia.  Does have facial droop on the right side.  Drift but does not  hit stretcher in right upper extremity.  Drift and hits stretcher in right lower extremity.  No drift in left upper or left lower extremity.  Psychiatric:        Mood and Affect: Mood normal.     (all labs ordered are listed, but only abnormal results are displayed) Labs Reviewed  CBC - Abnormal; Notable for the following components:      Result Value   WBC 15.5 (*)    All other components within normal limits  DIFFERENTIAL - Abnormal; Notable for the following components:   Neutro Abs 13.9 (*)    Abs Immature Granulocytes 0.09 (*)    All other components within normal limits  COMPREHENSIVE METABOLIC PANEL WITH GFR - Abnormal; Notable for the following components:   Glucose, Bld 275 (*)    BUN 47 (*)    Creatinine, Ser 1.37 (*)    GFR, Estimated 35 (*)    All other components within normal limits  HEMOGLOBIN A1C - Abnormal; Notable for the following components:   Hgb A1c MFr Bld 6.4 (*)    All other components within normal limits  LIPID PANEL - Abnormal; Notable for the following components:   LDL Cholesterol 112 (*)    All other components within normal limits  GLUCOSE, CAPILLARY - Abnormal; Notable for the following components:   Glucose-Capillary 195 (*)    All other components within normal limits  GLUCOSE, CAPILLARY - Abnormal; Notable for the following components:   Glucose-Capillary 175 (*)    All other components within normal limits  I-STAT CHEM 8, ED - Abnormal; Notable for the following components:   BUN 50 (*)    Creatinine, Ser 1.20 (*)    Glucose, Bld 276 (*)    All other components within normal limits  PROTIME-INR  APTT  ETHANOL  CBG MONITORING, ED    EKG: EKG Interpretation Date/Time:  Saturday February 29 2024 10:34:10 EDT Ventricular Rate:  81 PR Interval:  195 QRS Duration:  104 QT Interval:  396 QTC Calculation: 460 R Axis:   50  Text Interpretation: Sinus rhythm Abnormal R-wave progression, early transition Borderline repolarization  abnormality Confirmed by Yolande Charleston [phone]) on 02/29/2024 10:35:38 AM  Radiology: CT ANGIO HEAD NECK W WO CM (CODE STROKE) Result Date: 02/29/2024 EXAM: CT HEAD WITHOUT CTA HEAD AND NECK WITH AND WITHOUT 02/29/2024 10:07:02 AM TECHNIQUE: CTA of the head and neck was performed with and without the administration of intravenous contrast. Noncontrast CT of the head with reconstructed 2-D images  are also provided for review. Multiplanar 2D and/or 3D reformatted images are provided for review. Automated exposure control, iterative reconstruction, and/or weight based adjustment of the mA/kV was utilized to reduce the radiation dose to as low as reasonably achievable. COMPARISON: CT head without contrast 02/29/2024 CLINICAL HISTORY: Neuro deficit, acute, stroke suspected; R hemiplegia and neglect R gaze preference concern for L MCA occlusion. Code Stroke; Matthews [phone] FINDINGS: CT HEAD: BRAIN AND VENTRICLES: Left MCA territory infarct is again noted. ORBITS: No acute abnormality. SINUSES AND MASTOIDS: No acute abnormality. CTA NECK: AORTIC ARCH AND ARCH VESSELS: Extensive atherosclerotic changes are present at the aortic arch. The great vessels are patent. Carotid changes extend into the proximal left subclavian artery without significant stenosis. CERVICAL CAROTID ARTERIES: Atherosclerotic calcifications are present in the right carotid bifurcation and proximal right ICA without significant stenosis. Moderate tortuosity of the cervical right ICA is present without significant stenosis relative to the more distal vessel. Atherosclerotic calcifications are present in the left carotid bifurcation and proximal left ICA without significant stenosis relative to the more distal vessel. CERVICAL VERTEBRAL ARTERIES: The left vertebral artery is dominant. A high-grade stenosis is present in the proximal right vertebral artery. No distal stenoses are present in either vertebral artery in the neck. LUNGS AND MEDIASTINUM:  Unremarkable. SOFT TISSUES: Multinodular thyroid  present. The largest nodule is at the lower pole of the right lobe of the thyroid  measuring 20 mm. BONES: Multilevel degenerative changes are present in the cervical spine with ankylosis across the disc space C5-6 and C6-7. Slight degenerative anterolisthesis is present at C2-3 and C3-4. CTA HEAD: ANTERIOR CIRCULATION: Atherosclerotic changes are present within the cavernous internal carotid arteries bilaterally without significant stenoses. High-grade stenosis is present in the proximal left M1 segment. The inferior and anterior left M2 branches are occluded. Minimal collateral vessels are present. Moderate narrowing is present in the distal right P2 segment. POSTERIOR CIRCULATION: No significant stenosis of the posterior cerebral arteries. No significant stenosis of the basilar artery. No significant stenosis of the vertebral arteries. No aneurysm. OTHER: No dural venous sinus thrombosis on this non-dedicated study. IMPRESSION: 1. Left MCA territory infarct. 2. High-grade stenosis in the proximal left M1 segment 3. Occlusion of the inferior and anterior left M2 branches. Minimal collateral vessels are present. 4. High-grade stenosis in the proximal right vertebral artery. The findings were communicated to Dr. Matthews via the Am[employer] system at 10:28 am. Electronically signed by: Lonni Necessary MD 02/29/2024 10:28 AM EDT RP Workstation: [id number]   CT HEAD CODE STROKE WO CONTRAST Result Date: 02/29/2024 CLINICAL DATA:  Code stroke.  89 year old female. EXAM: CT HEAD WITHOUT CONTRAST TECHNIQUE: Contiguous axial images were obtained from the base of the skull through the vertex without intravenous contrast. RADIATION DOSE REDUCTION: This exam was performed according to the departmental dose-optimization program which includes automated exposure control, adjustment of the mA and/or kV according to patient size and/or use of iterative reconstruction technique.  COMPARISON:  Brain MRI 08/18/2003.  Head CT 08/17/2003. FINDINGS: Brain: Cytotoxic edema left hemisphere. Left MCA territory. ASPECTS 4. Associated petechial hemorrhage posterior left temporal lobe (coronal image 27). No malignant hemorrhagic transformation. Mild mass effect on the left lateral ventricle. No midline shift. Basilar cisterns remain patent. Contralateral right hemisphere and posterior fossa gray-white differentiation maintained. No other acute intracranial hemorrhage. No ventriculomegaly. Vascular: Hyperdense left MCA branch suspected distal M1 region coronal image 39. Calcified atherosclerosis at the skull base. Skull: Intact.  No acute osseous abnormality identified. Sinuses/Orbits: New but chronic right maxillary  sinus mucoperiosteal thickening since 2005. Other Visualized paranasal sinuses and mastoids are stable and well aerated. Other: No convincing gaze deviation, acute orbit or scalp soft tissue finding. ASPECTS [facility] Stroke Program Early CT Score) - Ganglionic level infarction (caudate, lentiform nuclei, internal capsule, insula, M1-M3 cortex): 3 - Supraganglionic infarction (M4-M6 cortex): 1 Total score (0-10 with 10 being normal): 4 IMPRESSION: 1. Recent Left MCA infarct with fairly extensive cytotoxic edema and small petechial hemorrhage. ASPECTS 4. Hyperdense Left MCA distal M1 suspected. 2. No malignant hemorrhagic transformation. No significant intracranial mass effect at this time. These results were communicated to Dr. Matthews at 10:05 am on 02/29/2024 by text page via the [facility] messaging system. Electronically Signed   By: VEAR Hurst M.D.   On: 02/29/2024 10:06     Procedures   Medications Ordered in the ED  aspirin  chewable tablet 81 mg ( Oral See Alternative 02/29/24 1154)    Or  aspirin  suppository 300 mg (300 mg Rectal Given 02/29/24 1154)   stroke: early stages of recovery book (has no administration in time range)  0.9 %  sodium chloride  infusion ( Intravenous Infusion  Verify 02/29/24 1600)  acetaminophen  (TYLENOL ) tablet 650 mg (has no administration in time range)    Or  acetaminophen  (TYLENOL ) 160 MG/5ML solution 650 mg (has no administration in time range)    Or  acetaminophen  (TYLENOL ) suppository 650 mg (has no administration in time range)  insulin  aspart (novoLOG ) injection 0-9 Units (2 Units Subcutaneous Given 02/29/24 1605)  hydrALAZINE  (APRESOLINE ) injection 5 mg (has no administration in time range)  morphine  (PF) 2 MG/ML injection 1 mg (has no administration in time range)  scopolamine  (TRANSDERM-SCOP) 1 MG/3DAYS 1 mg (1 mg Transdermal Patch Applied 02/29/24 1605)  sodium chloride  flush (NS) 0.9 % injection 3 mL (3 mLs Intravenous Given 02/29/24 1207)  iohexol  (OMNIPAQUE ) 350 MG/ML injection 100 mL (60 mLs Intravenous Contrast Given 02/29/24 1007)    Clinical Course as of 02/29/24 1852  Sat Feb 29, 2024  1036 Dw Dr Matthews. Not thrombectomy candidate. May need to go on hospice and comfort care.  [RP]  1145 Dr Dino from hospitalist to admit the patient. [RP]  1157 DNR/DNI confirmed with patient's daughter Kaitlyn Garrison at the bedside.  [RP]    Clinical Course User Index [RP] Yolande Lamar BROCKS, MD                                 Medical Decision Making Amount and/or Complexity of Data Reviewed Labs: ordered. Radiology: ordered.  Risk Decision regarding hospitalization.   Havanah Tericka Devincenzi is a 89 year old female with a history of vertigo and hypertension who presents to the emergency department with weakness and difficulty speaking.    Initial Ddx:  Stroke, ICH, hypoglycemia, meningitis/infection  MDM:  Concerned about a stroke given the patient's symptoms.  Patient is protecting airway at this time.  Code stroke activated in the field.  Out of the window for TNK but does appear to potentially have an LVO due to her arm weakness and aphasia.  Will check a CBG to ensure that the patient is not hypoglycemic and a CT of the head to  evaluate for ICH.  No signs of infection currently on history or exam to suggest an infectious cause of their symptoms.  Plan:  CBG Labs Code stroke activation CT head  ED Summary/Re-evaluation:  CT head shows left MCA infarct.  Unfortunately is not  a thrombectomy candidate.  Neurology discussed with patient's family and she has a very poor prognosis and may need to go on hospice.  Family is not ready for for full comfort measures understands that she could deteriorate especially due to cerebral edema given the large stroke that she had.  DNR/DNI with daughter at bedside.  Admitted to hospitalist for further management.  This patient presents to the ED for concern of complaints listed in HPI, this involves an extensive number of treatment options, and is a complaint that carries with it a high risk of complications and morbidity. Disposition including potential need for admission considered.   Dispo: Admit to Floor  Additional history obtained from daughter Records reviewed Outpatient Clinic Notes The following labs were independently interpreted: Chemistry and show CKD I independently reviewed the following imaging with scope of interpretation limited to determining acute life threatening conditions related to emergency care: CT Head and agree with the radiologist interpretation with the following exceptions: none I personally reviewed and interpreted cardiac monitoring: normal sinus rhythm  I personally reviewed and interpreted the pt's EKG: see above for interpretation  I have reviewed the patients home medications and made adjustments as needed Consults: Hospitalist and neurology Social Determinants of health:  Geriatric  CRITICAL CARE Performed by: Lamar JAYSON Shan   Total critical care time: 30 minutes  Critical care time was exclusive of separately billable procedures and treating other patients.  Critical care was necessary to treat or prevent imminent or life-threatening  deterioration.  Critical care was time spent personally by me on the following activities: development of treatment plan with patient and/or surrogate as well as nursing, discussions with consultants, evaluation of patient's response to treatment, examination of patient, obtaining history from patient or surrogate, ordering and performing treatments and interventions, ordering and review of laboratory studies, ordering and review of radiographic studies, pulse oximetry and re-evaluation of patient's condition.    Final diagnoses:  Acute ischemic left MCA stroke [facility])  Aphasia  Weakness    ED Discharge Orders     None          Shan Lamar JAYSON, MD 02/29/24 [phone]

## 2024-02-29 NOTE — Plan of Care (Signed)

## 2024-02-29 NOTE — Consult Note (Signed)
 NEUROLOGY CONSULT NOTE   Date of service: February 29, 2024 Patient Name: Kaitlyn Garrison MRN:  [id number] DOB:  Oct 31, 1924 Chief Complaint: Code stroke Requesting Provider: Yolande Lamar BROCKS, MD  History of Present Illness  Kaitlyn Garrison is a 89 y.o. female with hx of type 2 diabetes mellitus, hypertension, hyperlipidemia, anemia, vertigo, gout, CKD stage IV, GI bleed in 2019 who presented to the ED this morning via EMS after being found by family with right sided weakness and aphasia.  At baseline, patient lives with family and is able to maintain her ADLs independently.  She was last seen in her usual state of health last night at 18:15.  This morning, family found patient with a left-sided gaze preference, right-sided weakness, and unable to speak prompting EMS activation.  LKW: 02/28/24 at 18:15 Modified rankin score: 0-Completely asymptomatic and back to baseline post- stroke IV Thrombolysis: No, patient presented outside of the thrombolytic therapy time window EVT: No, left MCA infarct on CT with extensive cytotoxic edema with an ASPECTS of 4- heavy stroke burden on arrival.   NIHSS components Score: Comment  1a Level of Conscious 0[]  1[x]  2[]  3[]      1b LOC Questions 0[]  1[]  2[x]       1c LOC Commands 0[]  1[]  2[x]       2 Best Gaze 0[]  1[x]  2[]       3 Visual 0[]  1[]  2[x]  3[]      4 Facial Palsy 0[]  1[]  2[x]  3[]      5a Motor Arm - left 0[x]  1[]  2[]  3[]  4[]  UN[]    5b Motor Arm - Right 0[]  1[]  2[x]  3[]  4[]  UN[]    6a Motor Leg - Left 0[]  1[]  2[x]  3[]  4[]  UN[]    6b Motor Leg - Right 0[]  1[]  2[]  3[x]  4[]  UN[]    7 Limb Ataxia 0[x]  1[]  2[]  UN[]      8 Sensory 0[x]  1[]  2[]  UN[]      9 Best Language 0[]  1[]  2[]  3[x]      10 Dysarthria 0[]  1[]  2[x]  UN[]      11 Extinct. and Inattention 0[]  1[x]  2[]       TOTAL:  23    ROS  Unable to ascertain due to patient condition: aphasic on arrival Past History   Past Medical History:  Diagnosis Date   Hypertension    Vertigo    Past Surgical  History:  Procedure Laterality Date   ABDOMINAL HYSTERECTOMY     TONSILLECTOMY     Family History: No family history on file.  Social History  reports that she has never smoked. She has never used smokeless tobacco. She reports that she does not drink alcohol  and does not use drugs.  Allergies  Allergen Reactions   Codeine Nausea And Vomiting   Morphine  Nausea And Vomiting   Medications   Current Facility-Administered Medications:    sodium chloride  flush (NS) 0.9 % injection 3 mL, 3 mL, Intravenous, Once, Yolande Lamar BROCKS, MD  Current Outpatient Medications:    acetaminophen  (TYLENOL ) 325 MG tablet, Take 2 tablets (650 mg total) by mouth every 6 (six) hours as needed., Disp: 36 tablet, Rfl: 0   acetaminophen  (TYLENOL ) 500 MG tablet, Take 500 mg by mouth every 6 (six) hours as needed., Disp: , Rfl:    allopurinol  (ZYLOPRIM ) 300 MG tablet, Take 300 mg by mouth daily., Disp: , Rfl:    aspirin  81 MG EC tablet, Take 81 mg by mouth daily., Disp: , Rfl:    atenolol  (TENORMIN ) 50 MG tablet, Take 50 mg  by mouth daily., Disp: , Rfl:    colchicine 0.6 MG tablet, Take 0.6 mg by mouth daily as needed (for gout flare). , Disp: , Rfl:    cyclobenzaprine (FLEXERIL) 5 MG tablet, Take 5 mg by mouth 3 (three) times daily as needed for muscle spasms., Disp: , Rfl:    ferrous sulfate  325 (65 FE) MG EC tablet, Take 325 mg by mouth daily with breakfast., Disp: , Rfl:    ibuprofen  (ADVIL ) 600 MG tablet, Take 1 tablet (600 mg total) by mouth every 6 (six) hours as needed., Disp: 30 tablet, Rfl: 0   meclizine  (ANTIVERT ) 12.5 MG tablet, Take 12.5 mg by mouth 3 (three) times daily as needed for dizziness., Disp: , Rfl:    Menthol, Topical Analgesic, (BIOFREEZE ROLL-ON EX), Apply topically., Disp: , Rfl:    Omega-3 Fatty Acids (FISH OIL) 1200 MG CAPS, Take 1 capsule by mouth daily at 6 (six) AM., Disp: , Rfl:    pantoprazole  (PROTONIX ) 40 MG tablet, Take 1 tablet (40 mg total) by mouth daily., Disp: 30  tablet, Rfl: 0   triamterene -hydrochlorothiazide  (MAXZIDE -25) 37.5-25 MG tablet, Take 1 tablet by mouth daily., Disp: , Rfl:   Vitals   Vitals:   02/29/24 0957 02/29/24 1015 02/29/24 1020  BP:   (!) 155/103  Pulse:  86 81  Resp:  19 16  Temp:   (!) 97.4 F (36.3 C)  TempSrc:   Oral  SpO2:  100% 100%  Weight: 82.8 kg      Body mass index is 31.83 kg/m.  Physical Exam   Constitutional: Ill appearing Caucasian female laying on EMS stretcher Psych: She is unable to cooperate with exam due to aphasia Eyes: No scleral injection.  HENT: No OP obstruction.  Head: Normocephalic.  Cardiovascular: Normal rate and regular rhythm. Respiratory: Effort normal, non-labored breathing on nasal cannula GI: Soft.  No distension. There is no tenderness.  Skin: WDI.   Neurologic Examination   Mental Status: Patient is drowsy but awakens with stimulation.  She is aphasic and is unable to follow commands.  She is able to mimic and maintain limb elevation with passive elevation but is unable to follow verbal commands.  She does not attempt to speak to examiner. Patient is unable to give a clear and coherent history. Cranial Nerves: II: Does not blink to threat on the right. III,IV, VI: Left gaze preference, eyes will cross midline briefly with oculocephalic maneuver V: Patient does not react to touch on bilateral face VII: Right facial weakness is noted VIII: Hearing is intact to voice- patient opens eyes to voice X: Does not allow for visualization of soft palate XI: Does not shrug shoulders to command XII: Does not protrude tongue to command Motor: With passive elevation, patient will maintain antigravity position of left upper extremity without vertical drift.  Right upper extremity attempts antigravity movement with drift to bed.  Right lower extremity moves without gravity.  Left lower extremity attempts antigravity movement with drift to bed. Sensory: She does react to application  noxious stimuli throughout though with subtle withdrawal response throughout.  Plantars: Toes are downgoing bilaterally.  Cerebellar: Does not perform Gait: Not assessed for patient safety.   Labs/Imaging/Neurodiagnostic studies  CBC:  Recent Labs  Lab 02/29/24 0955 02/29/24 1001  WBC 15.5*  --   NEUTROABS 13.9*  --   HGB 13.3 14.3  HCT 42.1 42.0  MCV 88.3  --   PLT 256  --    Basic Metabolic Panel:  Lab  Results  Component Value Date   NA 141 02/29/2024   K 4.2 02/29/2024   CO2 22 02/29/2024   GLUCOSE 276 (H) 02/29/2024   BUN 50 (H) 02/29/2024   CREATININE 1.20 (H) 02/29/2024   CALCIUM 9.7 02/29/2024   GFRNONAA 35 (L) 02/29/2024   GFRAA 41 (L) 10/22/2017   Lipid Panel: No results found for: LDLCALC HgbA1c: No results found for: HGBA1C Urine Drug Screen: No results found for: LABOPIA, COCAINSCRNUR, LABBENZ, AMPHETMU, THCU, LABBARB  Alcohol  Level     Component Value Date/Time   ETH <15 02/29/2024 0955   CT Head without contrast (Personally reviewed): Recent left MCA infarct with fairly extensive cytotoxic edema and small petechial hemorrhage.  ASPECTS of 4.  Hyperdense left MCA distal M1 suspected.  No malignant hemorrhagic transformation.  No significant intracranial mass effect at this time.  CT angio Head and Neck with contrast (Personally reviewed): Left MCA territory infarct.  High-grade stenosis in the proximal left M1 segment.  Occlusion of the inferior and anterior left M2 branches.  Minimal collateral vessels are present.  High-grade stenosis in the proximal right vertebral artery.  ASSESSMENT   Millena Cobi Aldape is a 89 y.o. female with PMHx of DM2, HTN, HLD, CKD stage IV, anemia, vertigo, gout, GI bleed in 2019 who presented to the ED this morning with right hemiparesis, right sided facial weakness, and aphasia with last known well last night 8/29 at 18:15.  Imaging on arrival showed a left MCA territory infarct with high-grade left M1 stenosis  and an ASPECTS of 4.  Patient presented outside of the thrombolytic therapy time window and given the extensive stroke burden on arrival with imaging findings as above, she is not a candidate for IR.   RECOMMENDATIONS  - HgbA1c, fasting lipid panel - MRI of the brain without contrast - Frequent neuro checks - Echocardiogram - Treat SBP > 180/105 mmHg (relative permissive HTN 2/2 amt of petechial hemorrhage) - Prophylactic therapy- Antiplatelet med: Aspirin  - dose 325mg  PO or 300mg  PR  - Risk factor modification - Telemetry monitoring - PT consult, OT consult, Speech consult - Stroke team to follow ______________________________________________________________________  Signed, Mimi LELON Ny, NP [facility]   Attending Neurohospitalist Addendum Patient seen and examined with APP/Resident. Agree with the history and physical as documented above. Agree with the plan as documented, which I helped formulate. I have edited the note above to reflect my full findings and recommendations. I have independently reviewed the chart, obtained history, review of systems and examined the patient.I have personally reviewed pertinent head/neck/spine imaging (CT/MRI). Please feel free to call with any questions.  Patient presented with completed L MCA ischemic infarct, unfortunately outside of the window for TNK. Not a candidate for IR thrombectomy 2/2 ASPECTS 4. Stroke team to follow starting tomorrow.  -- Elida Ross, MD [facility] [phone]  If 7pm- 7am, please page neurology on call as listed in [facility].

## 2024-02-29 NOTE — H&P (Addendum)
 History and Physical    Kaitlyn Garrison FMW:[id number] DOB: Dec 23, 1924 DOA: 02/29/2024  DOS: the patient was seen and examined on 02/29/2024  PCP: Onita Rush, MD   Patient coming from: Home  I have personally briefly reviewed patient's old medical records in [facility] and CareEverywhere  HPI:  Fusae Gyneth Hubka is a 89 y.o. year old female with past medical history of vertigo, HTN, type 2 diabetes mellitus, chronic kidney disease, hyperlipidemia, osteoarthritis, giant cell arteritis, iron deficiency anemia, and Gout.  She presents to [facility] ED after being found not speaking and not moving her right side by her family.  Last known normal 6:15 PM last night and had been in her normal state of health prior.    ED Course: On arrival to [facility] ED as a code stroke patient was noted to be afebrile temp 36.3 C, BP 208/96, HR 86, RR 16, SpO2 98% on 4 L via nasal cannula.  CT head obtained and shows left MCA infarct with extensive cytotoxic edema and small petechial hemorrhage.  CT head obtained and shows high-grade stenosis in the proximal right vertebral artery and high-grade stenosis in the proximal left M1 segment and occlusion of the inferior and anterior left M2 branches with minimal collaterals.  Labs notable for Cytosis WBC 15.5, glucose 275, BUN 47, creatinine 1.37.  She was loaded with aspirin  and started on 81 mg daily.  She was evaluated in the ED by neurology as part of the code stroke activation. TRH contacted for admission.   Review of Systems  Unable to perform ROS: Patient nonverbal    Past Medical History:  Diagnosis Date   Hypertension    Vertigo     Past Surgical History:  Procedure Laterality Date   ABDOMINAL HYSTERECTOMY     TONSILLECTOMY       reports that she has never smoked. She has never used smokeless tobacco. She reports that she does not drink alcohol  and does not use drugs.  Allergies  Allergen Reactions   Codeine Nausea And Vomiting    Morphine  Nausea And Vomiting    No family history on file.  Prior to Admission medications   Medication Sig Start Date End Date Taking? Authorizing Provider  acetaminophen  (TYLENOL ) 325 MG tablet Take 2 tablets (650 mg total) by mouth every 6 (six) hours as needed. 10/10/21   Elnor Jayson LABOR, DO  acetaminophen  (TYLENOL ) 500 MG tablet Take 500 mg by mouth every 6 (six) hours as needed.    [provider]  allopurinol  (ZYLOPRIM ) 300 MG tablet Take 300 mg by mouth daily.    [provider]  aspirin  81 MG EC tablet Take 81 mg by mouth daily.    [provider]  atenolol  (TENORMIN ) 50 MG tablet Take 50 mg by mouth daily.    [provider]  colchicine 0.6 MG tablet Take 0.6 mg by mouth daily as needed (for gout flare).     [provider]  cyclobenzaprine (FLEXERIL) 5 MG tablet Take 5 mg by mouth 3 (three) times daily as needed for muscle spasms.    [provider]  ferrous sulfate  325 (65 FE) MG EC tablet Take 325 mg by mouth daily with breakfast.    [provider]  ibuprofen  (ADVIL ) 600 MG tablet Take 1 tablet (600 mg total) by mouth every 6 (six) hours as needed. 10/10/21   Elnor Jayson LABOR, DO  meclizine  (ANTIVERT ) 12.5 MG tablet Take 12.5 mg by mouth 3 (three)  times daily as needed for dizziness.    [provider]  Menthol, Topical Analgesic, (BIOFREEZE ROLL-ON EX) Apply topically.    [provider]  Omega-3 Fatty Acids (FISH OIL) 1200 MG CAPS Take 1 capsule by mouth daily at 6 (six) AM.    [provider]  pantoprazole  (PROTONIX ) 40 MG tablet Take 1 tablet (40 mg total) by mouth daily. 10/24/17   Cheryle Page, MD  triamterene -hydrochlorothiazide  (MAXZIDE -25) 37.5-25 MG tablet Take 1 tablet by mouth daily.    [provider]    Physical Exam: Vitals:   02/29/24 1000 02/29/24 1015 02/29/24 1020 02/29/24 1117  BP: (!) 208/96  (!) 155/103   Pulse: 86 86 81   Resp:  19 16   Temp:   (!) 97.4 F  (36.3 C)   TempSrc:   Oral   SpO2: 98% 100% 100% 100%  Weight:        Physical Exam Vitals and nursing note reviewed.  Constitutional:      General: She is not in acute distress. HENT:     Head: Normocephalic.  Eyes:     Pupils: Pupils are equal, round, and reactive to light.  Cardiovascular:     Rate and Rhythm: Normal rate and regular rhythm.     Pulses: Normal pulses.     Heart sounds: No murmur heard.    No friction rub. No gallop.  Pulmonary:     Effort: Pulmonary effort is normal.     Breath sounds: No wheezing, rhonchi or rales.  Abdominal:     General: There is no distension.  Musculoskeletal:        General: Normal range of motion.  Skin:    General: Skin is warm and dry.     Capillary Refill: Capillary refill takes less than 2 seconds.  Neurological:     Mental Status: She is alert.     Cranial Nerves: Cranial nerve deficit and dysarthria present.     Motor: Weakness present.     Comments: Patient with expressive aphasia, nonverbal currently, with (R) sided weakness.  She is able to move her right hand and right toes but is unable to overcome gravity with either limb      Labs on Admission: I have personally reviewed following labs and imaging studies  CBC: Recent Labs  Lab 02/29/24 0955 02/29/24 1001  WBC 15.5*  --   NEUTROABS 13.9*  --   HGB 13.3 14.3  HCT 42.1 42.0  MCV 88.3  --   PLT 256  --    Basic Metabolic Panel: Recent Labs  Lab 02/29/24 0955 02/29/24 1001  NA 140 141  K 4.1 4.2  CL 105 108  CO2 22  --   GLUCOSE 275* 276*  BUN 47* 50*  CREATININE 1.37* 1.20*  CALCIUM 9.7  --    GFR: CrCl cannot be calculated (Unknown ideal weight.). Liver Function Tests: Recent Labs  Lab 02/29/24 0955  AST 27  ALT 19  ALKPHOS 72  BILITOT 0.5  PROT 7.7  ALBUMIN 3.9   No results for input(s): LIPASE, AMYLASE in the last 168 hours. No results for input(s): AMMONIA in the last 168 hours. Coagulation Profile: Recent Labs  Lab  02/29/24 0955  INR 1.0   Cardiac Enzymes: No results for input(s): CKTOTAL, CKMB, CKMBINDEX, TROPONINI, TROPONINIHS in the last 168 hours. BNP (last 3 results) No results for input(s): BNP in the last 8760 hours. HbA1C: No results for input(s): HGBA1C in the last 72 hours.  CBG: No results for input(s): GLUCAP in the last 168 hours. Lipid Profile: No results for input(s): CHOL, HDL, LDLCALC, TRIG, CHOLHDL, LDLDIRECT in the last 72 hours. Thyroid  Function Tests: No results for input(s): TSH, T4TOTAL, FREET4, T3FREE, THYROIDAB in the last 72 hours. Anemia Panel: No results for input(s): VITAMINB12, FOLATE, FERRITIN, TIBC, IRON, RETICCTPCT in the last 72 hours. Urine analysis:    Component Value Date/Time   COLORURINE YELLOW 10/22/2017 1520   APPEARANCEUR CLEAR 10/22/2017 1520   LABSPEC 1.015 10/22/2017 1520   PHURINE 5.5 10/22/2017 1520   GLUCOSEU NEGATIVE 10/22/2017 1520   HGBUR NEGATIVE 10/22/2017 1520   BILIRUBINUR NEGATIVE 10/22/2017 1520   KETONESUR NEGATIVE 10/22/2017 1520   PROTEINUR NEGATIVE 10/22/2017 1520   NITRITE NEGATIVE 10/22/2017 1520   LEUKOCYTESUR NEGATIVE 10/22/2017 1520    Radiological Exams on Admission: I have personally reviewed images CT ANGIO HEAD NECK W WO CM (CODE STROKE) Result Date: 02/29/2024 EXAM: CT HEAD WITHOUT CTA HEAD AND NECK WITH AND WITHOUT 02/29/2024 10:07:02 AM TECHNIQUE: CTA of the head and neck was performed with and without the administration of intravenous contrast. Noncontrast CT of the head with reconstructed 2-D images are also provided for review. Multiplanar 2D and/or 3D reformatted images are provided for review. Automated exposure control, iterative reconstruction, and/or weight based adjustment of the mA/kV was utilized to reduce the radiation dose to as low as reasonably achievable. COMPARISON: CT head without contrast 02/29/2024 CLINICAL HISTORY: Neuro deficit, acute, stroke  suspected; R hemiplegia and neglect R gaze preference concern for L MCA occlusion. Code Stroke; Matthews [phone] FINDINGS: CT HEAD: BRAIN AND VENTRICLES: Left MCA territory infarct is again noted. ORBITS: No acute abnormality. SINUSES AND MASTOIDS: No acute abnormality. CTA NECK: AORTIC ARCH AND ARCH VESSELS: Extensive atherosclerotic changes are present at the aortic arch. The great vessels are patent. Carotid changes extend into the proximal left subclavian artery without significant stenosis. CERVICAL CAROTID ARTERIES: Atherosclerotic calcifications are present in the right carotid bifurcation and proximal right ICA without significant stenosis. Moderate tortuosity of the cervical right ICA is present without significant stenosis relative to the more distal vessel. Atherosclerotic calcifications are present in the left carotid bifurcation and proximal left ICA without significant stenosis relative to the more distal vessel. CERVICAL VERTEBRAL ARTERIES: The left vertebral artery is dominant. A high-grade stenosis is present in the proximal right vertebral artery. No distal stenoses are present in either vertebral artery in the neck. LUNGS AND MEDIASTINUM: Unremarkable. SOFT TISSUES: Multinodular thyroid  present. The largest nodule is at the lower pole of the right lobe of the thyroid  measuring 20 mm. BONES: Multilevel degenerative changes are present in the cervical spine with ankylosis across the disc space C5-6 and C6-7. Slight degenerative anterolisthesis is present at C2-3 and C3-4. CTA HEAD: ANTERIOR CIRCULATION: Atherosclerotic changes are present within the cavernous internal carotid arteries bilaterally without significant stenoses. High-grade stenosis is present in the proximal left M1 segment. The inferior and anterior left M2 branches are occluded. Minimal collateral vessels are present. Moderate narrowing is present in the distal right P2 segment. POSTERIOR CIRCULATION: No significant stenosis of the  posterior cerebral arteries. No significant stenosis of the basilar artery. No significant stenosis of the vertebral arteries. No aneurysm. OTHER: No dural venous sinus thrombosis on this non-dedicated study. IMPRESSION: 1. Left MCA territory infarct. 2. High-grade stenosis in the proximal left M1 segment 3. Occlusion of the inferior and anterior left M2 branches. Minimal collateral vessels are present. 4. High-grade stenosis in the proximal right vertebral artery.  The findings were communicated to Dr. Matthews via the Amion system at 10:28 am. Electronically signed by: Lonni Necessary MD 02/29/2024 10:28 AM EDT RP Workstation: [id number]   CT HEAD CODE STROKE WO CONTRAST Result Date: 02/29/2024 CLINICAL DATA:  Code stroke.  89 year old female. EXAM: CT HEAD WITHOUT CONTRAST TECHNIQUE: Contiguous axial images were obtained from the base of the skull through the vertex without intravenous contrast. RADIATION DOSE REDUCTION: This exam was performed according to the departmental dose-optimization program which includes automated exposure control, adjustment of the mA and/or kV according to patient size and/or use of iterative reconstruction technique. COMPARISON:  Brain MRI 08/18/2003.  Head CT 08/17/2003. FINDINGS: Brain: Cytotoxic edema left hemisphere. Left MCA territory. ASPECTS 4. Associated petechial hemorrhage posterior left temporal lobe (coronal image 27). No malignant hemorrhagic transformation. Mild mass effect on the left lateral ventricle. No midline shift. Basilar cisterns remain patent. Contralateral right hemisphere and posterior fossa gray-white differentiation maintained. No other acute intracranial hemorrhage. No ventriculomegaly. Vascular: Hyperdense left MCA branch suspected distal M1 region coronal image 39. Calcified atherosclerosis at the skull base. Skull: Intact.  No acute osseous abnormality identified. Sinuses/Orbits: New but chronic right maxillary sinus mucoperiosteal thickening since  2005. Other Visualized paranasal sinuses and mastoids are stable and well aerated. Other: No convincing gaze deviation, acute orbit or scalp soft tissue finding. ASPECTS [facility] Stroke Program Early CT Score) - Ganglionic level infarction (caudate, lentiform nuclei, internal capsule, insula, M1-M3 cortex): 3 - Supraganglionic infarction (M4-M6 cortex): 1 Total score (0-10 with 10 being normal): 4 IMPRESSION: 1. Recent Left MCA infarct with fairly extensive cytotoxic edema and small petechial hemorrhage. ASPECTS 4. Hyperdense Left MCA distal M1 suspected. 2. No malignant hemorrhagic transformation. No significant intracranial mass effect at this time. These results were communicated to Dr. Matthews at 10:05 am on 02/29/2024 by text page via the [facility] messaging system. Electronically Signed   By: VEAR Hurst M.D.   On: 02/29/2024 10:06    EKG: My personal interpretation of EKG shows: Normal sinus rhythm, HR 81  Assessment/Plan Principal Problem:   CVA (cerebral vascular accident) [facility])   Active medical issues: ##Acute cerebrovascular accident CT head with a left MCA infarct with extensive cytotoxic edema and small petechial hemorrhage.  CTA Head/Neck with high-grade stenosis of the proximal left M1 and occlusion of the inferior and anterior left M2 branches with minimal collaterals - Permissive HTN x 48 hrs from sx onset; B/P <220/110. PRN hydralazine  if BP above these parameters. - ECHO - Will defer to Neuro for ASA and Plavix recommendations - A1C and LDL - Add statin when able to take p.o. - q4H neuro checks - STAT head CT for any change in neuro exam - Monitor on telemetry  - PT/OT/SLP - Stroke education - Palliative care consulted  Chronic medical issues: #Hypertension - Allow for permissive hypertension as above - As needed hydralazine  for SBP>220  # Chronic kidney disease - IVF hydration - Avoid nephrotoxins, contrast Dyes, Hypotension and Dehydration  - Repeat metabolic panel a.m.  labs  #Hyperlipidemia Not on any outpatient medications, intolerant to statins  #Type 2 diabetes mellitus Diet controlled - q4H CBG monitoring SSI - Check A1C   VTE prophylaxis:  SCDs  GI prophylaxis: Not indicated  Diet: N.p.o. Access: PIV Lines: None Code Status:  DNR/DNI(Do NOT Intubate) Telemetry: Yes  Admission status: Observation, Telemetry bed Patient is from: Home Anticipated d/c is to: SNF/hospice Anticipated d/c date is: 1-2 days Patient currently: Undergoing CVA workup  Family Communication: Daughter and  granddaughter at bedside  Consults called: Neurology, palliative care   Severity of Illness: The appropriate patient status for this patient is OBSERVATION. Observation status is judged to be reasonable and necessary in order to provide the required intensity of service to ensure the patient's safety. The patient's presenting symptoms, physical exam findings, and initial radiographic and laboratory data in the context of their medical condition is felt to place them at decreased risk for further clinical deterioration. Furthermore, it is anticipated that the patient will be medically stable for discharge from the hospital within 2 midnights of admission.   To reach the provider On-Call:   7AM- 7PM see care teams to locate the attending and reach out to them via [email or web address] Password: [email or web address] 7PM-7AM contact night-coverage If you still have difficulty reaching the appropriate provider, please page the [facility] (Director on Call) for [facility] on amion for assistance  This document was prepared using Conservation officer, historic buildings and may include unintentional dictation errors.  Rockie Rams FNP-BC, PMHNP-BC Nurse Practitioner [facility]

## 2024-02-29 NOTE — Evaluation (Signed)
 SLP Cancellation Note  Patient Details Name: Kaitlyn Garrison MRN: [id number] DOB: 08-03-1924   Cancelled treatment:       Reason Eval/Treat Not Completed: Other (comment);Fatigue/lethargy limiting ability to participate (pt too lethargic for BSE at this time; will continue efforts)  Madelin POUR, MS [facility] SLP Acute Rehab Services Office [phone]   Nicolas Emmie Caldron 02/29/2024, 3:37 PM

## 2024-02-29 NOTE — Code Documentation (Signed)
 Stroke Response Nurse Documentation Code Documentation  Kaitlyn Garrison is a 89 y.o. female arriving to [facility]  via [facility] on 02-29-2024 with past medical hx of DM, CKD, vertigo. On aspirin  81 mg daily. Code stroke was activated by EMS.   Patient from Home where she was LKW at 1815 02/28/24 and now complaining of Right side weakness, Left gaze deviation and not speaking  Stroke team at the bedside on patient arrival. Labs drawn and patient cleared for CT by Dr. Yolande. Patient to CT with team. NIHSS 24, see documentation for details and code stroke times. Patient with decreased LOC, disoriented, not following commands, left gaze preference , right hemianopia, right facial droop, right arm weakness, right leg weakness, Global aphasia , dysarthria , and right neglect on exam. The following imaging was completed:  CT Head and CTA. Patient is not a candidate for IV Thrombolytic due to LKW outside TNK window. Patient is not a candidate for IR due to changes noted on CT.   Care Plan: VS and NIHSS q 2 hours x 12 hour, then q 4 hours.   Process Delays Noted: none  Bedside handoff with ED RN Josh.    Elvin Portland  Stroke Response RN

## 2024-02-29 NOTE — ED Triage Notes (Signed)
 PT BIB [facility] as Code Stroke, LKW last night 1815 02/28/24.  PT presents with L. Side gaxze, R. Side facial droop, and R side weakness. PT is A&O x4 and ambulatory at baseline.   220/120 (fire), 180/120 (EMS), 98% 4L for EMS, RH 70, [id number], 12-lead good.   18G L. AC

## 2024-02-29 NOTE — ED Notes (Signed)
 Code stroke called: 0937 Pt arrival: 1948 LKW: 1815 EDP cleared for CT: 953 STroke team and Neurologist arrival: [phone]

## 2024-03-01 DIAGNOSIS — R4701 Aphasia: Secondary | ICD-10-CM | POA: Diagnosis not present

## 2024-03-01 DIAGNOSIS — Z90710 Acquired absence of both cervix and uterus: Secondary | ICD-10-CM | POA: Diagnosis not present

## 2024-03-01 DIAGNOSIS — G936 Cerebral edema: Secondary | ICD-10-CM | POA: Diagnosis not present

## 2024-03-01 DIAGNOSIS — Z885 Allergy status to narcotic agent status: Secondary | ICD-10-CM | POA: Diagnosis not present

## 2024-03-01 DIAGNOSIS — G8191 Hemiplegia, unspecified affecting right dominant side: Secondary | ICD-10-CM | POA: Diagnosis not present

## 2024-03-01 DIAGNOSIS — E1165 Type 2 diabetes mellitus with hyperglycemia: Secondary | ICD-10-CM | POA: Diagnosis not present

## 2024-03-01 DIAGNOSIS — R29810 Facial weakness: Secondary | ICD-10-CM | POA: Diagnosis not present

## 2024-03-01 DIAGNOSIS — R29723 NIHSS score 23: Secondary | ICD-10-CM | POA: Diagnosis not present

## 2024-03-01 DIAGNOSIS — I69391 Dysphagia following cerebral infarction: Secondary | ICD-10-CM

## 2024-03-01 DIAGNOSIS — I619 Nontraumatic intracerebral hemorrhage, unspecified: Secondary | ICD-10-CM | POA: Diagnosis not present

## 2024-03-01 DIAGNOSIS — N184 Chronic kidney disease, stage 4 (severe): Secondary | ICD-10-CM | POA: Diagnosis not present

## 2024-03-01 DIAGNOSIS — Z515 Encounter for palliative care: Secondary | ICD-10-CM

## 2024-03-01 DIAGNOSIS — Z79899 Other long term (current) drug therapy: Secondary | ICD-10-CM | POA: Diagnosis not present

## 2024-03-01 DIAGNOSIS — N179 Acute kidney failure, unspecified: Secondary | ICD-10-CM | POA: Diagnosis not present

## 2024-03-01 DIAGNOSIS — Z7982 Long term (current) use of aspirin: Secondary | ICD-10-CM | POA: Diagnosis not present

## 2024-03-01 DIAGNOSIS — Z66 Do not resuscitate: Secondary | ICD-10-CM | POA: Diagnosis not present

## 2024-03-01 DIAGNOSIS — R233 Spontaneous ecchymoses: Secondary | ICD-10-CM | POA: Diagnosis not present

## 2024-03-01 DIAGNOSIS — I6501 Occlusion and stenosis of right vertebral artery: Secondary | ICD-10-CM | POA: Diagnosis not present

## 2024-03-01 DIAGNOSIS — I63512 Cerebral infarction due to unspecified occlusion or stenosis of left middle cerebral artery: Secondary | ICD-10-CM | POA: Diagnosis not present

## 2024-03-01 DIAGNOSIS — E785 Hyperlipidemia, unspecified: Secondary | ICD-10-CM | POA: Diagnosis not present

## 2024-03-01 DIAGNOSIS — Z7189 Other specified counseling: Secondary | ICD-10-CM | POA: Diagnosis not present

## 2024-03-01 DIAGNOSIS — E669 Obesity, unspecified: Secondary | ICD-10-CM | POA: Diagnosis not present

## 2024-03-01 DIAGNOSIS — R404 Transient alteration of awareness: Secondary | ICD-10-CM | POA: Diagnosis not present

## 2024-03-01 DIAGNOSIS — E1122 Type 2 diabetes mellitus with diabetic chronic kidney disease: Secondary | ICD-10-CM | POA: Diagnosis not present

## 2024-03-01 DIAGNOSIS — D72829 Elevated white blood cell count, unspecified: Secondary | ICD-10-CM | POA: Diagnosis not present

## 2024-03-01 DIAGNOSIS — Z6830 Body mass index (BMI) 30.0-30.9, adult: Secondary | ICD-10-CM | POA: Diagnosis not present

## 2024-03-01 DIAGNOSIS — R1310 Dysphagia, unspecified: Secondary | ICD-10-CM | POA: Diagnosis not present

## 2024-03-01 DIAGNOSIS — Z743 Need for continuous supervision: Secondary | ICD-10-CM | POA: Diagnosis not present

## 2024-03-01 DIAGNOSIS — I129 Hypertensive chronic kidney disease with stage 1 through stage 4 chronic kidney disease, or unspecified chronic kidney disease: Secondary | ICD-10-CM | POA: Diagnosis not present

## 2024-03-01 DIAGNOSIS — Z7401 Bed confinement status: Secondary | ICD-10-CM | POA: Diagnosis not present

## 2024-03-01 LAB — GLUCOSE, CAPILLARY
Glucose-Capillary: 113 mg/dL — ABNORMAL HIGH (ref 70–99)
Glucose-Capillary: 118 mg/dL — ABNORMAL HIGH (ref 70–99)
Glucose-Capillary: 121 mg/dL — ABNORMAL HIGH (ref 70–99)

## 2024-03-01 MED ORDER — SODIUM CHLORIDE 0.9 % IV SOLN: INTRAVENOUS | Status: AC

## 2024-03-01 NOTE — Plan of Care (Signed)

## 2024-03-01 NOTE — Evaluation (Signed)
 Clinical/Bedside Swallow Evaluation Patient Details  Name: Kaitlyn Garrison MRN: [id number] Date of Birth: 27-Jan-1925  Today's Date: 03/01/2024 Time: SLP Start Time (ACUTE ONLY): 0940 SLP Stop Time (ACUTE ONLY): 1010 SLP Time Calculation (min) (ACUTE ONLY): 30 min  Past Medical History:  Past Medical History:  Diagnosis Date   Hypertension    Vertigo    Past Surgical History:  Past Surgical History:  Procedure Laterality Date   ABDOMINAL HYSTERECTOMY     TONSILLECTOMY     HPI:  Kaitlyn Garrison is a 89 y.o. year old female presents to [facility] ED after being found not speaking and not moving her right side by her family.  CT head obtained and shows left MCA infarct with extensive cytotoxic edema and small petechial hemorrhage.    Pt with past medical history of vertigo, HTN, type 2 diabetes mellitus, chronic kidney disease, hyperlipidemia, osteoarthritis, giant cell arteritis, iron deficiency anemia, and Gout.    Assessment / Plan / Recommendation  Clinical Impression  Pt presents with signs of a significant dysphagia given right facial weakness, no recognition of ice or cup edge despite hand over hand assist and max verbal and tactile cues. Pt unlikely to be able to accept oral intake for nutrition or even comfort in the short term given severity of presentation today. Pt was still very drowsy, but awake at time of assessment and family was present. Will f/u for further trials. SLP Visit Diagnosis: Dysphagia, unspecified (R13.10)    Aspiration Risk  Severe aspiration risk;Risk for inadequate nutrition/hydration    Diet Recommendation NPO         Other  Recommendations Oral Care Recommendations: Oral care QID     Assistance Recommended at Discharge    Functional Status Assessment    Frequency and Duration            Prognosis Prognosis for improved oropharyngeal function: Guarded Barriers to Reach Goals: Severity of deficits Barriers/Prognosis Comment: dysphagia,  awareness, aphasia, advanced age, arousal      Swallow Study   General HPI: Kaitlyn Garrison is a 89 y.o. year old female presents to [facility] ED after being found not speaking and not moving her right side by her family.  CT head obtained and shows left MCA infarct with extensive cytotoxic edema and small petechial hemorrhage.    Pt with past medical history of vertigo, HTN, type 2 diabetes mellitus, chronic kidney disease, hyperlipidemia, osteoarthritis, giant cell arteritis, iron deficiency anemia, and Gout. Type of Study: Bedside Swallow Evaluation Diet Prior to this Study: NPO Temperature Spikes Noted: No Respiratory Status: Room air History of Recent Intubation: No Behavior/Cognition: Lethargic/Drowsy;Requires cueing;Doesn't follow directions Oral Cavity Assessment: Excessive secretions Oral Care Completed by SLP: Yes Oral Cavity - Dentition: Adequate natural dentition Self-Feeding Abilities: Total assist Patient Positioning: Upright in bed Baseline Vocal Quality: Low vocal intensity Volitional Cough: Cognitively unable to elicit Volitional Swallow: Unable to elicit    Oral/Motor/Sensory Function Overall Oral Motor/Sensory Function: Other (comment) (doesnt f/c, right labial and lingual weakness)   Ice Chips Ice chips: Impaired Presentation: Spoon Oral Phase Impairments: Poor awareness of bolus Oral Phase Functional Implications: Oral holding   Thin Liquid Thin Liquid: Impaired Presentation: Cup;Spoon Oral Phase Impairments: Poor awareness of bolus    Nectar Thick Nectar Thick Liquid: Not tested   Honey Thick Honey Thick Liquid: Not tested   Puree Puree: Not tested   Solid     Solid: Not tested      Daxen Lanum, Consuelo Fitch  03/01/2024,10:22 AM

## 2024-03-01 NOTE — Consult Note (Addendum)
 Palliative Medicine Inpatient Consult Note  Consulting Provider:  Jurline Rockie CROME, NP   Reason for consult:   Palliative Care Consult Services Palliative Medicine Consult  Reason for Consult? Goals of Care   03/01/2024  HPI:  Per intake H&P --> Kaitlyn Garrison is a 89 y.o. year old female with past medical history of vertigo, HTN, type 2 diabetes mellitus, chronic kidney disease, hyperlipidemia, osteoarthritis, giant cell arteritis, iron deficiency anemia, and Gout.  She presents to [facility] ED after being found not speaking and not moving her right side by her family.  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 Palliative care has been asked to support additional goals of care conversations.   Clinical Assessment/Goals of Care:  *Please note that this is a verbal dictation therefore any spelling or grammatical errors are due to the Dragon Medical One system interpretation.  I have reviewed medical records including EPIC notes, labs and imaging, received report from bedside RN, assessed the patient who is lying in bed sleeping.  I attempted to arouse her though she did not awaken for me however her daughter did say that she got more restless overnight trying to grab at her bed that.    I met with [name]'s daughter, [name] to further discuss diagnosis prognosis, GOC, EOL wishes, disposition and options.   I introduced Palliative Medicine as specialized medical care for people living with serious illness. It focuses on providing relief from the symptoms and stress of a serious illness. The goal is to improve quality of life for both the patient and the family.  Medical History Review and Understanding:  A review of Kaitlyn Garrison's past medical history inclusive of type 2 diabetes, chronic kidney disease, osteoarthritis, giant cell arteritis, hypertension, vertigo, and gout was completed.  Social History:  Kaitlyn Garrison lives in [place] .  She is a widow.  She has 1 son who passed away and 1 daughter who is living.  She has 3 grandchildren and 8 great-grandchildren.  She is identified as a very active, social human.  She formally worked for [employer] later [employer].  She is a woman of faith though has not been to her church, [facility] since it dissolved.  Functional and Nutritional State:  Kaitlyn Garrison was fully independent and functional preceding hospitalization.  Advance Directives:  A detailed discussion was had today regarding advanced directives.  Patient does have advanced directives stating what she would desire her wishes would be.  Code Status:  Concepts specific to code status, artifical feeding and hydration, continued IV antibiotics and rehospitalization was had.  The difference between a aggressive medical intervention path  and a palliative comfort care path for this patient at this time was had.   [name] is an established DO NOT RESUSCITATE DO NOT INTUBATE CODE STATUS.  Discussion:  Patient's daughter shares with me how difficult this is for her.  She knows her mother is a very functional individual and she emphasizes the difficulty associated with seeing her decline so rapidly.  She laments what a beautiful life Kaitlyn Garrison has had and how she was always a go-getter.  She and I discussed the significance of the stroke that she has suffered.  She shares that her mother would not want to live a life whereby she is less functional.  She notes that her mother is living will has not been obtained because the elder law office is closed right now.  We reviewed  the larger picture of her mom's potential functional state moving forward.  We reviewed potentially seeing how she does with PT OT and speech though ultimately if no improvement the idea of hospice care.  Patient's daughter would prefer hospice of the [place] being that she lives proximal to the [place] area.  Patient's daughter is agreeable to a  referral to hospice.  Discussed the importance of continued conversation with family and their  medical providers regarding overall plan of care and treatment options, ensuring decisions are within the context of the patients values and GOCs. _______________________ Addendum:  I spoke with Kaitlyn Garrison's daughter, [name] this late afternoon. We reviewed patients current clinical state and the likelihood of improvement which per the primary team is anticipated to be little to none. We discussed the different options associated with hospice support. Home with hospice versus hospice at a facility. Right now patients daughter needs some time to gather her thoughts though is considering home with hospice most strongly. We discussed briefly what that would look like.   Patients daughter is reasonably expressing feelings of being overwhelmed. Allowed her time and space to vocalize this.   Plan for follow up in the oncoming days.   Add time: 59  Decision Maker: [name],[name] (Daughter): [phone] (Home Phone)   SUMMARY OF RECOMMENDATIONS   DNAR/DNI  Open and honest conversations held in the setting of patient's stroke and probable long-term outcomes  Patient's daughter in agreement with referral to hosp[facility] of the [place]  Ongoing palliative care support  Code Status/Advance Care Planning: DNAR/DNI  Palliative Prophylaxis:  Aspiration, Bowel Regimen, Delirium Protocol, Frequent Pain Assessment, Oral Care, Palliative Wound Care, and Turn Reposition  Additional Recommendations (Limitations, Scope, Preferences): Continue current care  Psycho-social/Spiritual:  Desire for further Chaplaincy support: Yes [facility] Additional Recommendations: Education on end-of-life   Prognosis: Limited overall  Discharge Planning: Discharge plan to be determined  Vitals:   03/01/24 0006 03/01/24 0357  BP: (!) 154/54 (!) 160/61  Pulse: 79 64  Resp: 16 15  Temp: 98.5 F (36.9 C) 98.8 F (37.1 C)  SpO2:  95% 97%    Intake/Output Summary (Last 24 hours) at 03/01/2024 0706 Last data filed at 03/01/2024 0400 Gross per 24 hour  Intake 365.8 ml  Output 600 ml  Net -234.2 ml   Last Weight  Most recent update: 02/29/2024  2:37 PM    Weight  80.9 kg (178 lb 5.6 oz)             LABS: CBC:    Component Value Date/Time   WBC 15.5 (H) 02/29/2024 0955   HGB 14.3 02/29/2024 1001   HCT 42.0 02/29/2024 1001   PLT 256 02/29/2024 0955   MCV 88.3 02/29/2024 0955   NEUTROABS 13.9 (H) 02/29/2024 0955   LYMPHSABS 0.7 02/29/2024 0955   MONOABS 0.8 02/29/2024 0955   EOSABS 0.0 02/29/2024 0955   BASOSABS 0.0 02/29/2024 0955   Comprehensive Metabolic Panel:    Component Value Date/Time   NA 141 02/29/2024 1001   K 4.2 02/29/2024 1001   CL 108 02/29/2024 1001   CO2 22 02/29/2024 0955   BUN 50 (H) 02/29/2024 1001   CREATININE 1.20 (H) 02/29/2024 1001   GLUCOSE 276 (H) 02/29/2024 1001   CALCIUM 9.7 02/29/2024 0955   AST 27 02/29/2024 0955   ALT 19 02/29/2024 0955   ALKPHOS 72 02/29/2024 0955   BILITOT 0.5 02/29/2024 0955   PROT 7.7 02/29/2024 0955   ALBUMIN 3.9 02/29/2024 0955    Gen:  Elderly Caucasian female HEENT: moist mucous membranes CV: Regular rate and rhythm PULM: On room air breathing is even and nonlabored ABD: soft/nontender EXT: No edema Neuro: Somnolent  PPS:    This conversation/these recommendations were discussed with patient primary care team, Dr. Dino ______________________________________________________ [name] [facility] Palliative Medicine Team Team Cell Phone: [phone] Please utilize secure chat with additional questions, if there is no response within 30 minutes please call the above phone number  Total Time: 75 Billing based on MDM: High  Palliative Medicine Team providers are available by phone from 7am to 7pm daily and can be reached through the team cell phone.  Should this patient require assistance outside of these hours,  please call the patient's attending physician.

## 2024-03-01 NOTE — Plan of Care (Signed)
  Problem: Clinical Measurements: Goal: Ability to maintain clinical measurements within normal limits will improve Outcome: Progressing Goal: Will remain free from infection Outcome: Progressing Goal: Diagnostic test results will improve Outcome: Progressing Goal: Respiratory complications will improve Outcome: Progressing Goal: Cardiovascular complication will be avoided Outcome: Progressing   Problem: Elimination: Goal: Will not experience complications related to bowel motility Outcome: Progressing Goal: Will not experience complications related to urinary retention Outcome: Progressing   Problem: Pain Managment: Goal: General experience of comfort will improve and/or be controlled Outcome: Progressing   Problem: Safety: Goal: Ability to remain free from injury will improve Outcome: Progressing   Problem: Skin Integrity: Goal: Risk for impaired skin integrity will decrease Outcome: Progressing

## 2024-03-01 NOTE — Progress Notes (Addendum)
 STROKE TEAM PROGRESS NOTE    INTERIM HISTORY/SUBJECTIVE  Patient has been hemodynamically stable and afebrile overnight.  She appears comfortable, and her family at bedside states that they are planning to go home with hospice care versus to an inpatient hospice.  OBJECTIVE  CBC    Component Value Date/Time   WBC 15.5 (H) 02/29/2024 0955   RBC 4.77 02/29/2024 0955   HGB 14.3 02/29/2024 1001   HCT 42.0 02/29/2024 1001   PLT 256 02/29/2024 0955   MCV 88.3 02/29/2024 0955   MCH 27.9 02/29/2024 0955   MCHC 31.6 02/29/2024 0955   RDW 13.2 02/29/2024 0955   LYMPHSABS 0.7 02/29/2024 0955   MONOABS 0.8 02/29/2024 0955   EOSABS 0.0 02/29/2024 0955   BASOSABS 0.0 02/29/2024 0955    BMET    Component Value Date/Time   NA 141 02/29/2024 1001   K 4.2 02/29/2024 1001   CL 108 02/29/2024 1001   CO2 22 02/29/2024 0955   GLUCOSE 276 (H) 02/29/2024 1001   BUN 50 (H) 02/29/2024 1001   CREATININE 1.20 (H) 02/29/2024 1001   CALCIUM 9.7 02/29/2024 0955   GFRNONAA 35 (L) 02/29/2024 0955    IMAGING past 24 hours MR BRAIN WO CONTRAST Result Date: 02/29/2024 CLINICAL DATA:  Follow-up examination for stroke. EXAM: MRI HEAD WITHOUT CONTRAST TECHNIQUE: Multiplanar, multiecho pulse sequences of the brain and surrounding structures were obtained without intravenous contrast. COMPARISON:  Comparison made with prior CTs from earlier the same day. FINDINGS: Brain: Generalized age-related cerebral atrophy. Mild chronic microvascular ischemic disease for age. Patchy and confluent restricted diffusion involving the left cerebral hemisphere, consistent with a large evolving acute left MCA distribution infarct. There is involvement of the left frontal, parietal, temporal, and occipital lobes, with mild patchy involvement of the underlying left basal ganglia. Associated gyral swelling and edema with trace 2 mm left-to-right shift at the septum pellucidum. Associated prominent petechial hemorrhage, most pronounced  at the left temporal lobe (series 12, image 27). No frank intraparenchymal hematoma formation by MRI. Smaller acute infarct noted involving the contralateral right frontal lobe (series 5, image 87). No associated hemorrhage or mass effect. No other evidence for acute or subacute ischemia. No other areas of chronic cortical infarction. No other acute or chronic intracranial blood products. No mass lesion. Ventricles normal size without hydrocephalus. No extra-axial fluid collection. Pituitary gland and suprasellar region within normal limits. Vascular: Major intracranial vascular flow voids are maintained. Skull and upper cervical spine: Craniocervical junction within normal limits. Bone marrow signal intensity normal. No scalp soft tissue abnormality. Sinuses/Orbits: Prior bilateral ocular lens replacement. Chronic right maxillary sinus disease noted. Paranasal sinuses are otherwise largely clear. No significant mastoid effusion. Other: None. IMPRESSION: 1. Large evolving acute left MCA distribution infarct. Associated gyral swelling and edema with trace 2 mm left-to-right shift. Associated prominent petechial hemorrhage, most pronounced at the left temporal lobe. No frank intraparenchymal hematoma formation by MRI. 2. Smaller acute contralateral right frontal lobe infarct. No associated hemorrhage or mass effect. 3. Underlying age-related cerebral atrophy with mild chronic small vessel ischemic disease. Electronically Signed   By: Morene Hoard M.D.   On: 02/29/2024 20:29    Vitals:   03/01/24 0006 03/01/24 0357 03/01/24 0837 03/01/24 1120  BP: (!) 154/54 (!) 160/61 (!) 178/63 (!) 172/57  Pulse: 79 64 62 66  Resp: 16 15 12 12   Temp: 98.5 F (36.9 C) 98.8 F (37.1 C) 99.6 F (37.6 C) 98.9 F (37.2 C)  TempSrc: Oral Axillary Axillary Axillary  SpO2: 95% 97% 97% 97%  Weight:      Height:         PHYSICAL EXAM General: Drowsy, well-nourished, well-developed elderly patient in no acute  distress Psych:  Mood and affect appropriate for situation CV: Regular rate and rhythm on monitor Respiratory:  Regular, unlabored respirations on room ai   NEURO:  Mental Status: Patient rests with eyes closed does not follow commands or respond to name Speech/Language: No verbal output  Cranial Nerves:  II: PERRL.  III, IV, VI: Does not track examiner V: Sensation is intact to light touch and symmetrical to face.  VII: Right facial droop VIII: hearing intact to voice. XII: None cooperative with tongue protrusion Motor: Moves left upper and lower extremities but does not lift them off the bed, no movement in right upper and lower extremities Tone: is normal and bulk is normal Sensation-appears intact to light touch Coordination: Able to perform Gait- deferred  Most Recent NIH 23  ASSESSMENT/PLAN  [name] is a 89 y.o. female with history of diabetes, hypertension, hyperlipidemia, anemia, vertigo, gout, CKD stage IV and GI bleed admitted for acute onset right-sided weakness and aphasia.  She presented outside the window for TNK, and mechanical thrombectomy was not performed due to aspect score of 4 on CT.  She was found on MRI to have large left MCA territory stroke.  Palliative care has been consulted, and family would like discharge home with hospice versus to inpatient hospice.  NIH on Admission 23  Stroke: large left MCA territory infarct with petechial hemorrhage, etiology: Large vessel occlusion concerning for cardioembolic source Code Stroke CT head recent left MCA infarct with cytotoxic edema and petechial hemorrhage ASPECTS 4 CTA head & neck left MCA territory infarct, high-grade stenosis in proximal left M1 segment, occlusion of inferior and anterior left M2 branches, high-grade stenosis in proximal right vertebral artery MRI large evolving acute left MCA distribution infarct, 2 mm left-to-right midline shift, prominent petechial hemorrhage and contralateral right  frontal lobe infarct  LDL 112 HgbA1c 6.4 VTE prophylaxis - SCDs aspirin  81 mg daily prior to admission, now on No antithrombotic as family leaning toward hospice and comfort care Therapy recommendations:  No follow up needed  Disposition: Home with hospice support   Hypertension Home meds: Triamterene -hydrochlorothiazide  37.5-25 once daily, atenolol  50 mg daily Stable Long term BP goal normotensive  Hyperlipidemia Home meds: None LDL 112, goal < 70 High intensity statin not indicated as patient is likely for home hospice  Diabetes type II Controlled Home meds: None HgbA1c 6.4, goal < 7.0 D/c CBGs for comfort D/c SSI for comfort  Dysphagia Patient has post-stroke dysphagia SLP signed off NPO now Allow comfort feeds  Other Stroke Risk Factors Advanced age Obesity, Body mass index is 30.61 kg/m., BMI >/= 30 associated with increased stroke risk, recommend weight loss, diet and exercise as appropriate   Other Active Problems Leukocytosis WBC 15.5  Hospital day # 0  Patient seen by NP and then by MD, MD to edit note as needed. Cortney E Everitt Clint Kill , MSN, AGACNP-BC [facility] See Amion for schedule and pager information 03/01/2024 12:36 PM  ATTENDING NOTE: I reviewed above note and agree with the assessment and plan. Pt was seen and examined.   Granddaughter and grandson are at bedside.  Patient lying in bed, eyes closed, not in distress.  Intermittent open eyes on voice, when eyes open, seem to have left gaze preference, not quite follow commands, right facial  droop, left upper extremity against gravity without drift, right upper extremity has some strength but not against gravity.  Bilateral lower extremity withdrawal to stimulation, left more than right.  Patient had left MCA large infarct with petechial hemorrhage, and left M2 occlusion with M1 high-grade stenosis.  Likely cardioembolic source.  Family has been working with palliative care and considering  home hospice.  On morphine  as needed.  For detailed assessment and plan, please refer to above as I have made changes wherever appropriate.   Neurology will sign off. Please call with questions. Thanks for the consult.   Ary Cummins, MD PhD Stroke Neurology 03/01/2024 3:01 PM      To contact Stroke Continuity provider, please refer to [email or web address]. After hours, contact General Neurology

## 2024-03-01 NOTE — Progress Notes (Signed)
 PROGRESS NOTE    Kaitlyn Garrison  FMW:[id number] DOB: 1924/08/10 DOA: 02/29/2024 PCP: Onita Rush, MD   Brief Narrative:    89 y.o. year old female with past medical history of vertigo, HTN, type 2 diabetes mellitus, chronic kidney disease, hyperlipidemia, osteoarthritis, giant cell arteritis, iron deficiency anemia, and Gout.  She was brought to [facility] ED after being found not speaking and not moving her right side by her family. CT scan head showed large acute Left MCA infarct with brain edema. Neurology was consulted. She was out of TNK window, Needs hospice. Palliative on board.  Assessment & Plan:  Principal Problem:   CVA (cerebral vascular accident) (HCC) Active Problems:   HTN (hypertension)   Type II diabetes mellitus (HCC)   CKD (chronic kidney disease) stage 3, GFR 30-59 ml/min (HCC)   Hyperlipidemia    Acute ischemic cerebrovascular accident involving left MCA leading to brain edema, swelling and midline shift,POA: CT head with a left MCA infarct with extensive cytotoxic edema and small petechial hemorrhage.  CTA Head/Neck with high-grade stenosis of the proximal left M1 and occlusion of the inferior and anterior left M2 branches with minimal collaterals -MRI Brain showed Large evolving acute left MCA distribution infarct. Associated gyral swelling and edema with trace 2 mm left-to-right shift. Associated prominent petechial hemorrhage, most pronounced at the left temporal lobe. No frank intraparenchymal hematoma formation by MRI, Smaller acute contralateral right frontal lobe infarct. No associated hemorrhage or mass effect, Underlying age-related cerebral atrophy with mild chronic small vessel ischemic disease. -Patient is obtunded - Palliative care consulted -Family is agreeable to hospice.   Chronic medical issues:  Hypertension - Allow for permissive hypertension as above    Chronic kidney disease stage IIIA - IVF hydration - Avoid nephrotoxins, contrast Dyes,  Hypotension   Hyperlipidemia Can't take oral meds because of being obtunded   Type 2 diabetes mellitus Diet controlled  Disposition: She lives at home with her daughter, She will likely need hospice at home.    DVT prophylaxis: SCD's Start: 02/29/24 1148     Code Status: Limited: Do not attempt resuscitation (DNR) -DNR-LIMITED -Do Not Intubate/DNI  Family Communication:  Grand daughter at bedside Status is: Inpt   Subjective:  Patient is obtunded and not able to follow any commands. Grand daughter is present at the bedside. We discussed in length about hospice and she would like to talk to the hospice nurse and get the process started.  Examination:  General exam: Obtunded with shallow breathing, unable to communicate Respiratory system: Shallow, slow breathing effort Cardiovascular system: S1 & S2 heard, RRR. No JVD, murmurs, rubs, gallops or clicks. No pedal edema. Gastrointestinal system: No abnormalities noted Central nervous system: Obtunded, unable to assess motor or sensory function Extremities: Obtunded Skin: No rashes, lesions or ulcers        Diet Orders (From admission, onward)     Start     Ordered   02/29/24 1148  Diet NPO time specified  Diet effective now        02/29/24 1150            Objective: Vitals:   02/29/24 2014 03/01/24 0006 03/01/24 0357 03/01/24 0837  BP: (!) 171/72 (!) 154/54 (!) 160/61 (!) 178/63  Pulse: 73 79 64 62  Resp: 16 16 15 12   Temp: 99.1 F (37.3 C) 98.5 F (36.9 C) 98.8 F (37.1 C) 99.6 F (37.6 C)  TempSrc: Oral Oral Axillary Axillary  SpO2: 95% 95% 97% 97%  Weight:      Height:        Intake/Output Summary (Last 24 hours) at 03/01/2024 1106 Last data filed at 03/01/2024 0400 Gross per 24 hour  Intake 365.8 ml  Output 600 ml  Net -234.2 ml   Filed Weights   02/29/24 0957 02/29/24 1258  Weight: 82.8 kg 80.9 kg    Scheduled Meds:  aspirin   81 mg Oral Daily   Or   aspirin   300 mg Rectal Daily    insulin  aspart  0-9 Units Subcutaneous Q4H   scopolamine   1 patch Transdermal Q72H   Continuous Infusions:  sodium chloride  75 mL/hr at 03/01/24 0240    Nutritional status     Body mass index is 30.61 kg/m.  Data Reviewed:   CBC: Recent Labs  Lab 02/29/24 0955 02/29/24 1001  WBC 15.5*  --   NEUTROABS 13.9*  --   HGB 13.3 14.3  HCT 42.1 42.0  MCV 88.3  --   PLT 256  --    Basic Metabolic Panel: Recent Labs  Lab 02/29/24 0955 02/29/24 1001  NA 140 141  K 4.1 4.2  CL 105 108  CO2 22  --   GLUCOSE 275* 276*  BUN 47* 50*  CREATININE 1.37* 1.20*  CALCIUM 9.7  --    GFR: Estimated Creatinine Clearance: 27.6 mL/min (A) (by C-G formula based on SCr of 1.2 mg/dL (H)). Liver Function Tests: Recent Labs  Lab 02/29/24 0955  AST 27  ALT 19  ALKPHOS 72  BILITOT 0.5  PROT 7.7  ALBUMIN 3.9   No results for input(s): LIPASE, AMYLASE in the last 168 hours. No results for input(s): AMMONIA in the last 168 hours. Coagulation Profile: Recent Labs  Lab 02/29/24 0955  INR 1.0   Cardiac Enzymes: No results for input(s): CKTOTAL, CKMB, CKMBINDEX, TROPONINI in the last 168 hours. BNP (last 3 results) No results for input(s): PROBNP in the last 8760 hours. HbA1C: Recent Labs    02/29/24 0955  HGBA1C 6.4*   CBG: Recent Labs  Lab 02/29/24 1550 02/29/24 2008 03/01/24 0002 03/01/24 0400 03/01/24 0738  GLUCAP 175* 134* 121* 113* 118*   Lipid Profile: Recent Labs    02/29/24 0955  CHOL 191  HDL 62  LDLCALC 112*  TRIG 85  CHOLHDL 3.1   Thyroid  Function Tests: No results for input(s): TSH, T4TOTAL, FREET4, T3FREE, THYROIDAB in the last 72 hours. Anemia Panel: No results for input(s): VITAMINB12, FOLATE, FERRITIN, TIBC, IRON, RETICCTPCT in the last 72 hours. Sepsis Labs: No results for input(s): PROCALCITON, LATICACIDVEN in the last 168 hours.  No results found for this or any previous visit (from the past 240  hours).       Radiology Studies: MR BRAIN WO CONTRAST Result Date: 02/29/2024 CLINICAL DATA:  Follow-up examination for stroke. EXAM: MRI HEAD WITHOUT CONTRAST TECHNIQUE: Multiplanar, multiecho pulse sequences of the brain and surrounding structures were obtained without intravenous contrast. COMPARISON:  Comparison made with prior CTs from earlier the same day. FINDINGS: Brain: Generalized age-related cerebral atrophy. Mild chronic microvascular ischemic disease for age. Patchy and confluent restricted diffusion involving the left cerebral hemisphere, consistent with a large evolving acute left MCA distribution infarct. There is involvement of the left frontal, parietal, temporal, and occipital lobes, with mild patchy involvement of the underlying left basal ganglia. Associated gyral swelling and edema with trace 2 mm left-to-right shift at the septum pellucidum. Associated prominent petechial hemorrhage, most pronounced at the left temporal lobe (series 12, image 27).  No frank intraparenchymal hematoma formation by MRI. Smaller acute infarct noted involving the contralateral right frontal lobe (series 5, image 87). No associated hemorrhage or mass effect. No other evidence for acute or subacute ischemia. No other areas of chronic cortical infarction. No other acute or chronic intracranial blood products. No mass lesion. Ventricles normal size without hydrocephalus. No extra-axial fluid collection. Pituitary gland and suprasellar region within normal limits. Vascular: Major intracranial vascular flow voids are maintained. Skull and upper cervical spine: Craniocervical junction within normal limits. Bone marrow signal intensity normal. No scalp soft tissue abnormality. Sinuses/Orbits: Prior bilateral ocular lens replacement. Chronic right maxillary sinus disease noted. Paranasal sinuses are otherwise largely clear. No significant mastoid effusion. Other: None. IMPRESSION: 1. Large evolving acute left MCA  distribution infarct. Associated gyral swelling and edema with trace 2 mm left-to-right shift. Associated prominent petechial hemorrhage, most pronounced at the left temporal lobe. No frank intraparenchymal hematoma formation by MRI. 2. Smaller acute contralateral right frontal lobe infarct. No associated hemorrhage or mass effect. 3. Underlying age-related cerebral atrophy with mild chronic small vessel ischemic disease. Electronically Signed   By: Morene Hoard M.D.   On: 02/29/2024 20:29   CT ANGIO HEAD NECK W WO CM (CODE STROKE) Result Date: 02/29/2024 EXAM: CT HEAD WITHOUT CTA HEAD AND NECK WITH AND WITHOUT 02/29/2024 10:07:02 AM TECHNIQUE: CTA of the head and neck was performed with and without the administration of intravenous contrast. Noncontrast CT of the head with reconstructed 2-D images are also provided for review. Multiplanar 2D and/or 3D reformatted images are provided for review. Automated exposure control, iterative reconstruction, and/or weight based adjustment of the mA/kV was utilized to reduce the radiation dose to as low as reasonably achievable. COMPARISON: CT head without contrast 02/29/2024 CLINICAL HISTORY: Neuro deficit, acute, stroke suspected; R hemiplegia and neglect R gaze preference concern for L MCA occlusion. Code Stroke; Matthews [phone] FINDINGS: CT HEAD: BRAIN AND VENTRICLES: Left MCA territory infarct is again noted. ORBITS: No acute abnormality. SINUSES AND MASTOIDS: No acute abnormality. CTA NECK: AORTIC ARCH AND ARCH VESSELS: Extensive atherosclerotic changes are present at the aortic arch. The great vessels are patent. Carotid changes extend into the proximal left subclavian artery without significant stenosis. CERVICAL CAROTID ARTERIES: Atherosclerotic calcifications are present in the right carotid bifurcation and proximal right ICA without significant stenosis. Moderate tortuosity of the cervical right ICA is present without significant stenosis relative to the  more distal vessel. Atherosclerotic calcifications are present in the left carotid bifurcation and proximal left ICA without significant stenosis relative to the more distal vessel. CERVICAL VERTEBRAL ARTERIES: The left vertebral artery is dominant. A high-grade stenosis is present in the proximal right vertebral artery. No distal stenoses are present in either vertebral artery in the neck. LUNGS AND MEDIASTINUM: Unremarkable. SOFT TISSUES: Multinodular thyroid  present. The largest nodule is at the lower pole of the right lobe of the thyroid  measuring 20 mm. BONES: Multilevel degenerative changes are present in the cervical spine with ankylosis across the disc space C5-6 and C6-7. Slight degenerative anterolisthesis is present at C2-3 and C3-4. CTA HEAD: ANTERIOR CIRCULATION: Atherosclerotic changes are present within the cavernous internal carotid arteries bilaterally without significant stenoses. High-grade stenosis is present in the proximal left M1 segment. The inferior and anterior left M2 branches are occluded. Minimal collateral vessels are present. Moderate narrowing is present in the distal right P2 segment. POSTERIOR CIRCULATION: No significant stenosis of the posterior cerebral arteries. No significant stenosis of the basilar artery. No significant stenosis of  the vertebral arteries. No aneurysm. OTHER: No dural venous sinus thrombosis on this non-dedicated study. IMPRESSION: 1. Left MCA territory infarct. 2. High-grade stenosis in the proximal left M1 segment 3. Occlusion of the inferior and anterior left M2 branches. Minimal collateral vessels are present. 4. High-grade stenosis in the proximal right vertebral artery. The findings were communicated to Dr. Matthews via the Amion system at 10:28 am. Electronically signed by: Lonni Necessary MD 02/29/2024 10:28 AM EDT RP Workstation: [id number]   CT HEAD CODE STROKE WO CONTRAST Result Date: 02/29/2024 CLINICAL DATA:  Code stroke.  89 year old female.  EXAM: CT HEAD WITHOUT CONTRAST TECHNIQUE: Contiguous axial images were obtained from the base of the skull through the vertex without intravenous contrast. RADIATION DOSE REDUCTION: This exam was performed according to the departmental dose-optimization program which includes automated exposure control, adjustment of the mA and/or kV according to patient size and/or use of iterative reconstruction technique. COMPARISON:  Brain MRI 08/18/2003.  Head CT 08/17/2003. FINDINGS: Brain: Cytotoxic edema left hemisphere. Left MCA territory. ASPECTS 4. Associated petechial hemorrhage posterior left temporal lobe (coronal image 27). No malignant hemorrhagic transformation. Mild mass effect on the left lateral ventricle. No midline shift. Basilar cisterns remain patent. Contralateral right hemisphere and posterior fossa gray-white differentiation maintained. No other acute intracranial hemorrhage. No ventriculomegaly. Vascular: Hyperdense left MCA branch suspected distal M1 region coronal image 39. Calcified atherosclerosis at the skull base. Skull: Intact.  No acute osseous abnormality identified. Sinuses/Orbits: New but chronic right maxillary sinus mucoperiosteal thickening since 2005. Other Visualized paranasal sinuses and mastoids are stable and well aerated. Other: No convincing gaze deviation, acute orbit or scalp soft tissue finding. ASPECTS [facility] Stroke Program Early CT Score) - Ganglionic level infarction (caudate, lentiform nuclei, internal capsule, insula, M1-M3 cortex): 3 - Supraganglionic infarction (M4-M6 cortex): 1 Total score (0-10 with 10 being normal): 4 IMPRESSION: 1. Recent Left MCA infarct with fairly extensive cytotoxic edema and small petechial hemorrhage. ASPECTS 4. Hyperdense Left MCA distal M1 suspected. 2. No malignant hemorrhagic transformation. No significant intracranial mass effect at this time. These results were communicated to Dr. Matthews at 10:05 am on 02/29/2024 by text page via the [facility]  messaging system. Electronically Signed   By: VEAR Hurst M.D.   On: 02/29/2024 10:06        LOS: 0 days   Time spent= 42 mins    Deliliah Room, MD [facility]  If 7PM-7AM, please contact night-coverage  03/01/2024, 11:06 AM

## 2024-03-01 NOTE — Progress Notes (Signed)
 PT Cancellation Note  Patient Details Name: Temprance Wyre MRN: [id number] DOB: May 22, 1925   Cancelled Treatment:    Reason Eval/Treat Not Completed: PT screened, no needs identified, will sign off (Pt going home with hospice will sign off at this time for physical therapy.)  Dorothyann Maier, DPT, CLT  Acute Rehabilitation Services Office: [phone] (Secure chat preferred)   Dorothyann VEAR Maier 03/01/2024, 11:35 AM

## 2024-03-01 NOTE — Progress Notes (Signed)
 OT Cancellation Note and Discharge  Patient Details Name: Angelo Caroll MRN: [id number] DOB: [birth year]   Cancelled Treatment:    Reason Eval/Treat Not Completed: Other (comment). Order received; however now pt is to go home with hospice, thus eval will not be completed and we will sign off.  Donny BECKER OT Acute Rehabilitation Services Office [phone]    [place] Dorothyann Distel 03/01/2024, 11:33 AM

## 2024-03-01 NOTE — Care Management Obs Status (Signed)
 MEDICARE OBSERVATION STATUS NOTIFICATION   Patient Details  Name: Kaitlyn Garrison MRN: [id number] Date of Birth: Nov 20, 1924   Medicare Observation Status Notification Given:  Yes    Marval Gell, RN 03/01/2024, 9:33 AM

## 2024-03-01 NOTE — Evaluation (Signed)
 Speech Language Pathology Evaluation Patient Details Name: Kaitlyn Garrison MRN: [id number] DOB: March 11, 1925 Today's Date: 03/01/2024 Time: 0940-1010 SLP Time Calculation (min) (ACUTE ONLY): 30 min  Problem List:  Patient Active Problem List   Diagnosis Date Noted   CVA (cerebral vascular accident) (HCC) 02/29/2024   Type II diabetes mellitus (HCC) 02/29/2024   CKD (chronic kidney disease) stage 3, GFR 30-59 ml/min (HCC) 02/29/2024   Hyperlipidemia 02/29/2024   GI bleed 10/22/2017   Symptomatic anemia 10/22/2017   Weakness 10/22/2017   HTN (hypertension) 10/22/2017   Vertigo 10/22/2017   Gout 10/22/2017   Past Medical History:  Past Medical History:  Diagnosis Date   Hypertension    Vertigo    Past Surgical History:  Past Surgical History:  Procedure Laterality Date   ABDOMINAL HYSTERECTOMY     TONSILLECTOMY     HPI:  Kaitlyn Garrison is a 89 y.o. year old female presents to [facility] ED after being found not speaking and not moving her right side by her family.  CT head obtained and shows left MCA infarct with extensive cytotoxic edema and small petechial hemorrhage.    Pt with past medical history of vertigo, HTN, type 2 diabetes mellitus, chronic kidney disease, hyperlipidemia, osteoarthritis, giant cell arteritis, iron deficiency anemia, and Gout.   Assessment / Plan / Recommendation Clinical Impression  Pt presents with severe global aphasia, right inattention and gaze deviated to the left. Pt difficult to arouse, provided oral care, wash cloth with hand over hand assist which pt did engage with a bit. Repositioned pt, used family as therapeutic agents. Pt did not follow commands with LUE despite max effort and tactile teaching and models given. Pt wiggled her fingers a bit during activity perhaps demonstrating she understood something was required of her hand. Pt also tracked a video of a great grandchild singing from left to midline for several second with total assist to  sustain eyelids open. Pt phonated some with activity, but no sounds of assent or dissent with prosodic cues, no articulation. Discussed findings of presumed global aphasia with family and poor implications for QOL and ability to rehabilitate. Family grateful for clear expectations. Encouraged family to interact with pt occasionally, attempt to arouse and engage her as tolerated.    SLP Assessment  SLP Recommendation/Assessment: Patient needs continued Speech Language Pathology Services SLP Visit Diagnosis: Aphasia (R47.01)     Assistance Recommended at Discharge  Frequent or constant Supervision/Assistance  Functional Status Assessment    Frequency and Duration min 2x/week  2 weeks      SLP Evaluation Cognition  Overall Cognitive Status: Impaired/Different from baseline Arousal/Alertness: Lethargic Orientation Level: Other (comment) (global aphasia) Attention: Focused Focused Attention: Impaired Focused Attention Impairment: Verbal basic;Functional basic       Comprehension  Auditory Comprehension Overall Auditory Comprehension: Impaired Yes/No Questions: Impaired Basic Biographical Questions: 0-25% accurate Commands: Impaired Interfering Components: Visual impairments    Expression Verbal Expression Overall Verbal Expression: Impaired Initiation: Impaired   Oral / Motor  Oral Motor/Sensory Function Overall Oral Motor/Sensory Function: Other (comment) (doesnt f/c, right labial and lingual weakness) Motor Speech Overall Motor Speech: Impaired Respiration: Within functional limits Phonation: Low vocal intensity            Kentley Cedillo, Consuelo Fitch 03/01/2024, 10:32 AM

## 2024-03-01 NOTE — TOC Initial Note (Signed)
 Transition of Care [facility]) - Initial/Assessment Note    Patient Details  Name: Kaitlyn Garrison MRN: [id number] Date of Birth: 1924/08/30  [facility] CM/SW Contact:    Marval Gell, RN Phone Number: 03/01/2024, 11:23 AM  Clinical Narrative:                  Notified by MD and PMT that patient will DC with Home Hospice. SPoke w daughter Kaitlyn Garrison and discussed options for providers, she would like HoP. She will need a hospital bed delivered the home before the patient is discharged.  Referral placed to [facility] w HoP.  Home address verified.  Expected Discharge Plan: Home w Hospice Care Barriers to Discharge: Continued Medical Work up   Patient Goals and CMS Choice Patient states their goals for this hospitalization and ongoing recovery are:: go home CMS Medicare.gov Compare Post Acute Care list provided to:: Other (Comment Required) Choice offered to / list presented to : Adult Children      Expected Discharge Plan and Services   Discharge Planning Services: CM Consult Post Acute Care Choice: Hospice, Durable Medical Equipment                               [facility] Agency: [facility] Date HH Agency Contacted: 03/01/24 Time HH Agency Contacted: 1123 Representative spoke with at [facility] Agency: Matilda  Prior Living Arrangements/Services   Lives with:: Adult Children                   Activities of Daily Living   ADL Screening (condition at time of admission) Independently performs ADLs?: Yes (appropriate for developmental age)  Permission Sought/Granted                  Emotional Assessment              Admission diagnosis:  CVA (cerebral vascular accident) [facility]) [I63.9] Patient Active Problem List   Diagnosis Date Noted   CVA (cerebral vascular accident) (HCC) 02/29/2024   Type II diabetes mellitus (HCC) 02/29/2024   CKD (chronic kidney disease) stage 3, GFR 30-59 ml/min (HCC) 02/29/2024   Hyperlipidemia 02/29/2024   GI bleed  10/22/2017   Symptomatic anemia 10/22/2017   Weakness 10/22/2017   HTN (hypertension) 10/22/2017   Vertigo 10/22/2017   Gout 10/22/2017   PCP:  Onita Rush, MD Pharmacy:   [facility] [address] [place], [place] - [address] [place] [address] Phone: [phone] Fax: [phone]     Social Drivers of Health (SDOH) Social History: SDOH Screenings   Housing: Patient Unable To Answer (02/29/2024)  Utilities: Patient Unable To Answer (02/29/2024)  Tobacco Use: Low Risk  (08/12/2023)   SDOH Interventions:     Readmission Risk Interventions     No data to display

## 2024-03-02 DIAGNOSIS — I63512 Cerebral infarction due to unspecified occlusion or stenosis of left middle cerebral artery: Secondary | ICD-10-CM | POA: Diagnosis not present

## 2024-03-02 MED ORDER — KETOROLAC TROMETHAMINE 15 MG/ML IJ SOLN: 15.0000 mg | Freq: Four times a day (QID) | INTRAMUSCULAR | Status: DC | PRN

## 2024-03-02 MED ORDER — ASPIRIN 300 MG RE SUPP
300.0000 mg | Freq: Every day | RECTAL | Status: DC
  Administered 2024-03-02 – 2024-03-03 (×2): 300 mg via RECTAL
  Filled 2024-03-02 (×3): qty 1

## 2024-03-02 NOTE — Progress Notes (Addendum)
 PROGRESS NOTE    Kaitlyn Garrison  FMW:[id number] DOB: 1924/08/13 DOA: 02/29/2024 PCP: Onita Rush, MD   Brief Narrative:    89 y.o. year old female with past medical history of vertigo, HTN, type 2 diabetes mellitus, chronic kidney disease, hyperlipidemia, osteoarthritis, giant cell arteritis, iron deficiency anemia, and Gout.  She was brought to [facility] ED after being found not speaking and not moving her right side by her family. CT scan head showed large acute Left MCA infarct with brain edema. Neurology was consulted. She was out of TNK window, Needs hospice. Palliative on board.  Assessment & Plan:  Principal Problem:   CVA (cerebral vascular accident) (HCC) Active Problems:   HTN (hypertension)   Type II diabetes mellitus (HCC)   CKD (chronic kidney disease) stage 3, GFR 30-59 ml/min (HCC)   Hyperlipidemia    Acute ischemic cerebrovascular accident involving left MCA leading to brain edema, swelling and midline shift,POA: CT head with a left MCA infarct with extensive cytotoxic edema and small petechial hemorrhage.  CTA Head/Neck with high-grade stenosis of the proximal left M1 and occlusion of the inferior and anterior left M2 branches with minimal collaterals -MRI Brain showed Large evolving acute left MCA distribution infarct. Associated gyral swelling and edema with trace 2 mm left-to-right shift. Associated prominent petechial hemorrhage, most pronounced at the left temporal lobe. No frank intraparenchymal hematoma formation by MRI, Smaller acute contralateral right frontal lobe infarct. No associated hemorrhage or mass effect, Underlying age-related cerebral atrophy with mild chronic small vessel ischemic disease. -Patient is obtunded - Palliative care consulted -Family is thinking about hospice but hasn't made any final decisions yet. -continue with rectal aspirin  as per family wishes along with IVF. -Neuro has signed off. -Pleasure feeds if able to eat and awake enough    Chronic medical issues:  Hypertension - continue to monitor    Chronic kidney disease stage IIIA - IVF hydration - Avoid nephrotoxins, contrast Dyes, Hypotension   Hyperlipidemia Can't take oral meds because of being obtunded   Type 2 diabetes mellitus Diet controlled  Disposition: She lives at home with her daughter, She will likely need hospice at home.    DVT prophylaxis: SCD's Start: 02/29/24 1148     Code Status: Limited: Do not attempt resuscitation (DNR) -DNR-LIMITED -Do Not Intubate/DNI  Family Communication:  Daughter at bedside Status is: Inpt   Subjective:  Daughter is present at the bedside. She is not ready yet to transition the patient to hospice. She is waiting on getting paper work from the lawyer's office before making any final decisions. She wants us  to continue the IVF meanwhile. She also mentioned that morphine  and any other opioids make her sick so I discussed about discontinuing that. She will talk to palliative team about next steps in terms of hospice transition.  Examination:  General exam: Obtunded with shallow breathing, unable to communicate Respiratory system: Shallow, slow breathing effort Cardiovascular system: S1 & S2 heard, RRR. No JVD, murmurs, rubs, gallops or clicks. No pedal edema. Gastrointestinal system: No abnormalities noted Central nervous system: Obtunded, unable to assess motor or sensory function Extremities: Obtunded Skin: No rashes, lesions or ulcers        Diet Orders (From admission, onward)     Start     Ordered   02/29/24 1148  Diet NPO time specified  Diet effective now        02/29/24 1150            Objective: Vitals:  03/01/24 1120 03/01/24 1544 03/01/24 1953 03/02/24 0743  BP: (!) 172/57  (!) 189/74 (!) 192/73  Pulse: 66 63 65 66  Resp: 12 18 18 18   Temp: 98.9 F (37.2 C)  (!) 100.8 F (38.2 C) 98.5 F (36.9 C)  TempSrc: Axillary  Oral Oral  SpO2: 97% 96% 97% 97%  Weight:      Height:         Intake/Output Summary (Last 24 hours) at 03/02/2024 0848 Last data filed at 03/02/2024 0600 Gross per 24 hour  Intake 470.15 ml  Output 1400 ml  Net -929.85 ml   Filed Weights   02/29/24 0957 02/29/24 1258  Weight: 82.8 kg 80.9 kg    Scheduled Meds:  scopolamine   1 patch Transdermal Q72H   Continuous Infusions:  sodium chloride  75 mL/hr at 03/01/24 2030    Nutritional status     Body mass index is 30.61 kg/m.  Data Reviewed:   CBC: Recent Labs  Lab 02/29/24 0955 02/29/24 1001  WBC 15.5*  --   NEUTROABS 13.9*  --   HGB 13.3 14.3  HCT 42.1 42.0  MCV 88.3  --   PLT 256  --    Basic Metabolic Panel: Recent Labs  Lab 02/29/24 0955 02/29/24 1001  NA 140 141  K 4.1 4.2  CL 105 108  CO2 22  --   GLUCOSE 275* 276*  BUN 47* 50*  CREATININE 1.37* 1.20*  CALCIUM 9.7  --    GFR: Estimated Creatinine Clearance: 27.6 mL/min (A) (by C-G formula based on SCr of 1.2 mg/dL (H)). Liver Function Tests: Recent Labs  Lab 02/29/24 0955  AST 27  ALT 19  ALKPHOS 72  BILITOT 0.5  PROT 7.7  ALBUMIN 3.9   No results for input(s): LIPASE, AMYLASE in the last 168 hours. No results for input(s): AMMONIA in the last 168 hours. Coagulation Profile: Recent Labs  Lab 02/29/24 0955  INR 1.0   Cardiac Enzymes: No results for input(s): CKTOTAL, CKMB, CKMBINDEX, TROPONINI in the last 168 hours. BNP (last 3 results) No results for input(s): PROBNP in the last 8760 hours. HbA1C: Recent Labs    02/29/24 0955  HGBA1C 6.4*   CBG: Recent Labs  Lab 02/29/24 1550 02/29/24 2008 03/01/24 0002 03/01/24 0400 03/01/24 0738  GLUCAP 175* 134* 121* 113* 118*   Lipid Profile: Recent Labs    02/29/24 0955  CHOL 191  HDL 62  LDLCALC 112*  TRIG 85  CHOLHDL 3.1   Thyroid  Function Tests: No results for input(s): TSH, T4TOTAL, FREET4, T3FREE, THYROIDAB in the last 72 hours. Anemia Panel: No results for input(s): VITAMINB12, FOLATE,  FERRITIN, TIBC, IRON, RETICCTPCT in the last 72 hours. Sepsis Labs: No results for input(s): PROCALCITON, LATICACIDVEN in the last 168 hours.  No results found for this or any previous visit (from the past 240 hours).       Radiology Studies: MR BRAIN WO CONTRAST Result Date: 02/29/2024 CLINICAL DATA:  Follow-up examination for stroke. EXAM: MRI HEAD WITHOUT CONTRAST TECHNIQUE: Multiplanar, multiecho pulse sequences of the brain and surrounding structures were obtained without intravenous contrast. COMPARISON:  Comparison made with prior CTs from earlier the same day. FINDINGS: Brain: Generalized age-related cerebral atrophy. Mild chronic microvascular ischemic disease for age. Patchy and confluent restricted diffusion involving the left cerebral hemisphere, consistent with a large evolving acute left MCA distribution infarct. There is involvement of the left frontal, parietal, temporal, and occipital lobes, with mild patchy involvement of the underlying left basal ganglia.  Associated gyral swelling and edema with trace 2 mm left-to-right shift at the septum pellucidum. Associated prominent petechial hemorrhage, most pronounced at the left temporal lobe (series 12, image 27). No frank intraparenchymal hematoma formation by MRI. Smaller acute infarct noted involving the contralateral right frontal lobe (series 5, image 87). No associated hemorrhage or mass effect. No other evidence for acute or subacute ischemia. No other areas of chronic cortical infarction. No other acute or chronic intracranial blood products. No mass lesion. Ventricles normal size without hydrocephalus. No extra-axial fluid collection. Pituitary gland and suprasellar region within normal limits. Vascular: Major intracranial vascular flow voids are maintained. Skull and upper cervical spine: Craniocervical junction within normal limits. Bone marrow signal intensity normal. No scalp soft tissue abnormality. Sinuses/Orbits:  Prior bilateral ocular lens replacement. Chronic right maxillary sinus disease noted. Paranasal sinuses are otherwise largely clear. No significant mastoid effusion. Other: None. IMPRESSION: 1. Large evolving acute left MCA distribution infarct. Associated gyral swelling and edema with trace 2 mm left-to-right shift. Associated prominent petechial hemorrhage, most pronounced at the left temporal lobe. No frank intraparenchymal hematoma formation by MRI. 2. Smaller acute contralateral right frontal lobe infarct. No associated hemorrhage or mass effect. 3. Underlying age-related cerebral atrophy with mild chronic small vessel ischemic disease. Electronically Signed   By: Morene Hoard M.D.   On: 02/29/2024 20:29   CT ANGIO HEAD NECK W WO CM (CODE STROKE) Result Date: 02/29/2024 EXAM: CT HEAD WITHOUT CTA HEAD AND NECK WITH AND WITHOUT 02/29/2024 10:07:02 AM TECHNIQUE: CTA of the head and neck was performed with and without the administration of intravenous contrast. Noncontrast CT of the head with reconstructed 2-D images are also provided for review. Multiplanar 2D and/or 3D reformatted images are provided for review. Automated exposure control, iterative reconstruction, and/or weight based adjustment of the mA/kV was utilized to reduce the radiation dose to as low as reasonably achievable. COMPARISON: CT head without contrast 02/29/2024 CLINICAL HISTORY: Neuro deficit, acute, stroke suspected; R hemiplegia and neglect R gaze preference concern for L MCA occlusion. Code Stroke; Matthews [phone] FINDINGS: CT HEAD: BRAIN AND VENTRICLES: Left MCA territory infarct is again noted. ORBITS: No acute abnormality. SINUSES AND MASTOIDS: No acute abnormality. CTA NECK: AORTIC ARCH AND ARCH VESSELS: Extensive atherosclerotic changes are present at the aortic arch. The great vessels are patent. Carotid changes extend into the proximal left subclavian artery without significant stenosis. CERVICAL CAROTID ARTERIES:  Atherosclerotic calcifications are present in the right carotid bifurcation and proximal right ICA without significant stenosis. Moderate tortuosity of the cervical right ICA is present without significant stenosis relative to the more distal vessel. Atherosclerotic calcifications are present in the left carotid bifurcation and proximal left ICA without significant stenosis relative to the more distal vessel. CERVICAL VERTEBRAL ARTERIES: The left vertebral artery is dominant. A high-grade stenosis is present in the proximal right vertebral artery. No distal stenoses are present in either vertebral artery in the neck. LUNGS AND MEDIASTINUM: Unremarkable. SOFT TISSUES: Multinodular thyroid  present. The largest nodule is at the lower pole of the right lobe of the thyroid  measuring 20 mm. BONES: Multilevel degenerative changes are present in the cervical spine with ankylosis across the disc space C5-6 and C6-7. Slight degenerative anterolisthesis is present at C2-3 and C3-4. CTA HEAD: ANTERIOR CIRCULATION: Atherosclerotic changes are present within the cavernous internal carotid arteries bilaterally without significant stenoses. High-grade stenosis is present in the proximal left M1 segment. The inferior and anterior left M2 branches are occluded. Minimal collateral vessels are present. Moderate  narrowing is present in the distal right P2 segment. POSTERIOR CIRCULATION: No significant stenosis of the posterior cerebral arteries. No significant stenosis of the basilar artery. No significant stenosis of the vertebral arteries. No aneurysm. OTHER: No dural venous sinus thrombosis on this non-dedicated study. IMPRESSION: 1. Left MCA territory infarct. 2. High-grade stenosis in the proximal left M1 segment 3. Occlusion of the inferior and anterior left M2 branches. Minimal collateral vessels are present. 4. High-grade stenosis in the proximal right vertebral artery. The findings were communicated to Dr. Matthews via the Am[employer]  system at 10:28 am. Electronically signed by: Lonni Necessary MD 02/29/2024 10:28 AM EDT RP Workstation: [id number]   CT HEAD CODE STROKE WO CONTRAST Result Date: 02/29/2024 CLINICAL DATA:  Code stroke.  89 year old female. EXAM: CT HEAD WITHOUT CONTRAST TECHNIQUE: Contiguous axial images were obtained from the base of the skull through the vertex without intravenous contrast. RADIATION DOSE REDUCTION: This exam was performed according to the departmental dose-optimization program which includes automated exposure control, adjustment of the mA and/or kV according to patient size and/or use of iterative reconstruction technique. COMPARISON:  Brain MRI 08/18/2003.  Head CT 08/17/2003. FINDINGS: Brain: Cytotoxic edema left hemisphere. Left MCA territory. ASPECTS 4. Associated petechial hemorrhage posterior left temporal lobe (coronal image 27). No malignant hemorrhagic transformation. Mild mass effect on the left lateral ventricle. No midline shift. Basilar cisterns remain patent. Contralateral right hemisphere and posterior fossa gray-white differentiation maintained. No other acute intracranial hemorrhage. No ventriculomegaly. Vascular: Hyperdense left MCA branch suspected distal M1 region coronal image 39. Calcified atherosclerosis at the skull base. Skull: Intact.  No acute osseous abnormality identified. Sinuses/Orbits: New but chronic right maxillary sinus mucoperiosteal thickening since 2005. Other Visualized paranasal sinuses and mastoids are stable and well aerated. Other: No convincing gaze deviation, acute orbit or scalp soft tissue finding. ASPECTS [facility] Stroke Program Early CT Score) - Ganglionic level infarction (caudate, lentiform nuclei, internal capsule, insula, M1-M3 cortex): 3 - Supraganglionic infarction (M4-M6 cortex): 1 Total score (0-10 with 10 being normal): 4 IMPRESSION: 1. Recent Left MCA infarct with fairly extensive cytotoxic edema and small petechial hemorrhage. ASPECTS 4.  Hyperdense Left MCA distal M1 suspected. 2. No malignant hemorrhagic transformation. No significant intracranial mass effect at this time. These results were communicated to Dr. Matthews at 10:05 am on 02/29/2024 by text page via the [facility] messaging system. Electronically Signed   By: VEAR Hurst M.D.   On: 02/29/2024 10:06        LOS: 1 day   Time spent= 42 mins    Deliliah Room, MD [facility]  If 7PM-7AM, please contact night-coverage  03/02/2024, 8:48 AM

## 2024-03-02 NOTE — Progress Notes (Signed)
 SLP Cancellation Note  Patient Details Name: Jany Buckwalter MRN: [id number] DOB: 26-Feb-1925   Cancelled treatment:       Reason Eval/Treat Not Completed: SLP screened, no needs identified, will sign off. Pt to transition to hospice care.    Mildred Tuccillo, Consuelo Fitch 03/02/2024, 8:12 AM

## 2024-03-03 DIAGNOSIS — Z7189 Other specified counseling: Secondary | ICD-10-CM | POA: Diagnosis not present

## 2024-03-03 DIAGNOSIS — Z515 Encounter for palliative care: Secondary | ICD-10-CM | POA: Diagnosis not present

## 2024-03-03 DIAGNOSIS — I63512 Cerebral infarction due to unspecified occlusion or stenosis of left middle cerebral artery: Secondary | ICD-10-CM | POA: Diagnosis not present

## 2024-03-03 MED ORDER — SODIUM CHLORIDE 0.9 % IV SOLN: INTRAVENOUS | Status: DC

## 2024-03-03 NOTE — Progress Notes (Signed)
 PROGRESS NOTE    Kaitlyn Garrison  FMW:[id number] DOB: 1925/03/05 DOA: 02/29/2024 PCP: Onita Rush, MD   Brief Narrative:    89 y.o. year old female with past medical history of vertigo, HTN, type 2 diabetes mellitus, chronic kidney disease, hyperlipidemia, osteoarthritis, giant cell arteritis, iron deficiency anemia, and Gout.  She was brought to [facility] ED after being found not speaking and not moving her right side by her family. CT scan head showed large acute Left MCA infarct with brain edema. Neurology was consulted. She was out of TNK window, Needs hospice. Palliative on board.  Assessment & Plan:  Principal Problem:   CVA (cerebral vascular accident) (HCC) Active Problems:   HTN (hypertension)   Type II diabetes mellitus (HCC)   CKD (chronic kidney disease) stage 3, GFR 30-59 ml/min (HCC)   Hyperlipidemia    Acute ischemic cerebrovascular accident involving left MCA leading to brain edema, swelling and midline shift,POA: CT head with a left MCA infarct with extensive cytotoxic edema and small petechial hemorrhage.  CTA Head/Neck with high-grade stenosis of the proximal left M1 and occlusion of the inferior and anterior left M2 branches with minimal collaterals -MRI Brain showed Large evolving acute left MCA distribution infarct. Associated gyral swelling and edema with trace 2 mm left-to-right shift. Associated prominent petechial hemorrhage, most pronounced at the left temporal lobe. No frank intraparenchymal hematoma formation by MRI, Smaller acute contralateral right frontal lobe infarct. No associated hemorrhage or mass effect, Underlying age-related cerebral atrophy with mild chronic small vessel ischemic disease. -Patient is obtunded -Family is thinking about hospice but hasn't made any final decisions yet. -continue with rectal aspirin  as per family wishes along with IVF. -Neuro has signed off. -Unable to take oral feeds -Palliative team on board, appreciate  assistance.   Chronic medical issues:  Hypertension - continue to monitor    Chronic kidney disease stage IIIA - IVF hydration - Avoid nephrotoxins, contrast Dyes, Hypotension   Hyperlipidemia Can't take oral meds because of being obtunded   Type 2 diabetes mellitus Diet controlled  Disposition: She lives at home with her daughter, She will likely need hospice at home.    DVT prophylaxis: SCD's Start: 02/29/24 1148     Code Status: Limited: Do not attempt resuscitation (DNR) -DNR-LIMITED -Do Not Intubate/DNI  Family Communication:  Daughter at bedside Status is: Inpt   Subjective:  Patient is unable to communicate and is obtunded. Daughter is present at the bedside, She is awaiting a call back from lawyer's office. She feels that her mother is at peace and is not suffering. She wanted us  to restart her IVF for her Peace of mind.  Examination:  General exam: Obtunded with shallow breathing, unable to communicate Respiratory system: Shallow, slow breathing effort Cardiovascular system: S1 & S2 heard, RRR. No JVD, murmurs, rubs, gallops or clicks. No pedal edema. Gastrointestinal system: No abnormalities noted Central nervous system: Obtunded, unable to assess motor or sensory function Extremities: Obtunded Skin: No rashes, lesions or ulcers        Diet Orders (From admission, onward)     Start     Ordered   02/29/24 1148  Diet NPO time specified  Diet effective now        02/29/24 1150            Objective: Vitals:   03/01/24 1953 03/02/24 0743 03/02/24 2041 03/03/24 0804  BP: (!) 189/74 (!) 192/73 (!) 199/69 93/73  Pulse: 65 66 67 75  Resp: 18 18  18 17  Temp: (!) 100.8 F (38.2 C) 98.5 F (36.9 C) 98.7 F (37.1 C) 98.3 F (36.8 C)  TempSrc: Oral Oral Oral   SpO2: 97% 97% 97% 94%  Weight:      Height:        Intake/Output Summary (Last 24 hours) at 03/03/2024 0908 Last data filed at 03/03/2024 0348 Gross per 24 hour  Intake --  Output 550  ml  Net -550 ml   Filed Weights   02/29/24 0957 02/29/24 1258  Weight: 82.8 kg 80.9 kg    Scheduled Meds:  aspirin   300 mg Rectal Daily   scopolamine   1 patch Transdermal Q72H   Continuous Infusions:  sodium chloride       Nutritional status     Body mass index is 30.61 kg/m.  Data Reviewed:   CBC: Recent Labs  Lab 02/29/24 0955 02/29/24 1001  WBC 15.5*  --   NEUTROABS 13.9*  --   HGB 13.3 14.3  HCT 42.1 42.0  MCV 88.3  --   PLT 256  --    Basic Metabolic Panel: Recent Labs  Lab 02/29/24 0955 02/29/24 1001  NA 140 141  K 4.1 4.2  CL 105 108  CO2 22  --   GLUCOSE 275* 276*  BUN 47* 50*  CREATININE 1.37* 1.20*  CALCIUM 9.7  --    GFR: Estimated Creatinine Clearance: 27.6 mL/min (A) (by C-G formula based on SCr of 1.2 mg/dL (H)). Liver Function Tests: Recent Labs  Lab 02/29/24 0955  AST 27  ALT 19  ALKPHOS 72  BILITOT 0.5  PROT 7.7  ALBUMIN 3.9   No results for input(s): LIPASE, AMYLASE in the last 168 hours. No results for input(s): AMMONIA in the last 168 hours. Coagulation Profile: Recent Labs  Lab 02/29/24 0955  INR 1.0   Cardiac Enzymes: No results for input(s): CKTOTAL, CKMB, CKMBINDEX, TROPONINI in the last 168 hours. BNP (last 3 results) No results for input(s): PROBNP in the last 8760 hours. HbA1C: Recent Labs    02/29/24 0955  HGBA1C 6.4*   CBG: Recent Labs  Lab 02/29/24 1550 02/29/24 2008 03/01/24 0002 03/01/24 0400 03/01/24 0738  GLUCAP 175* 134* 121* 113* 118*   Lipid Profile: Recent Labs    02/29/24 0955  CHOL 191  HDL 62  LDLCALC 112*  TRIG 85  CHOLHDL 3.1   Thyroid  Function Tests: No results for input(s): TSH, T4TOTAL, FREET4, T3FREE, THYROIDAB in the last 72 hours. Anemia Panel: No results for input(s): VITAMINB12, FOLATE, FERRITIN, TIBC, IRON, RETICCTPCT in the last 72 hours. Sepsis Labs: No results for input(s): PROCALCITON, LATICACIDVEN in the last 168  hours.  No results found for this or any previous visit (from the past 240 hours).       Radiology Studies: No results found.       LOS: 2 days   Time spent= 38 mins    Deliliah Room, MD [facility]  If 7PM-7AM, please contact night-coverage  03/03/2024, 9:08 AM

## 2024-03-03 NOTE — TOC Progression Note (Signed)
 Transition of Care [facility]) - Progression Note    Patient Details  Name: Kaitlyn Garrison MRN: [id number] Date of Birth: 01-13-1925  [facility]) CM/SW Contact  Andrez JULIANNA George, RN Phone Number: 03/03/2024, 2:07 PM  Clinical Narrative:     Plan is for home with hospice. [facility] [place] to see daughter today to arrange needed DME.  IP Care management following.  Expected Discharge Plan: Home w Hospice Care Barriers to Discharge: Continued Medical Work up               Expected Discharge Plan and Services   Discharge Planning Services: CM Consult Post Acute Care Choice: Hospice, Durable Medical Equipment                               [facility] Agency: [facility] Date [facility] Agency Contacted: 03/01/24 Time HH Agency Contacted: [phone] Representative spoke with at [facility] Agency: Matilda   Social Drivers of Health (SDOH) Interventions SDOH Screenings   Housing: Patient Unable To Answer (02/29/2024)  Utilities: Patient Unable To Answer (02/29/2024)  Tobacco Use: Low Risk  (08/12/2023)    Readmission Risk Interventions     No data to display

## 2024-03-03 NOTE — TOC CAGE-AID Note (Signed)
 Transition of Care [facility]) - CAGE-AID Screening   Patient Details  Name: Elantra Geisha Abernathy MRN: [id number] Date of Birth: 1925-07-01  Transition of Care [facility]) CM/SW Contact:    Shawnda Mauney E Parish Dubose, LCSW Phone Number: 03/03/2024, 11:27 AM   Clinical Narrative:    CAGE-AID Screening: Substance Abuse Screening unable to be completed due to: : Patient unable to participate

## 2024-03-03 NOTE — Progress Notes (Addendum)
 Palliative Medicine Inpatient Follow Up Note HPI: Kaitlyn Garrison is a 89 y.o. year old female with past medical history of vertigo, HTN, type 2 diabetes mellitus, chronic kidney disease, hyperlipidemia, osteoarthritis, giant cell arteritis, iron deficiency anemia, and Gout.  She presents to [facility] ED after being found not speaking and not moving her right side by her family.  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 Palliative care has been asked to support additional goals of care conversations.   Today's Discussion 03/03/2024  *Please note that this is a verbal dictation therefore any spelling or grammatical errors are due to the Dragon Medical One system interpretation.  Chart reviewed inclusive of vital signs, progress notes, laboratory results, and diagnostic images.   I assessed Kaitlyn Garrison this morning. She is somnolent and neglects to open her eyes or speak. She sleeps throughout the time I am at bedside.  I spoke with [name]'s daughter, [name]. We discussed how hard of a decision this is for her. She shares she is still processing all that has occurred. She feels that she is in a better mental and emotional space since Sunday. [name] remains to desire seeing [name]'s living will to better discern what her wishes were. She plans to speak to the Elder Law office this morning and to move forward with decisions from there.   Created space and opportunity for [name] to explore thoughts feelings and fears regarding current medical situation. She expresses grief related to the plans she and her mother had and the lack of the ability (now) to do them. Allowed her the time needed to express these emotions.  [name] was thankful to meet with [facility] over the phone.   [name] and I discussed what the death and dying process looks like in patients who are no longer able to eat or drink.   _________________________ Addendum:  I met with [name] this afternoon. We reviewed Kaitlyn Garrison's living will documents to verify that the actions taken are congruent with them.  I spoke with her after we reviewed the documents. She shares that she is at peace as what she is doing is clearly what was desired to be done. We reviewed the plan for transition home with hospice. Patients daughter is hopeful this can happen as early as tomorrow.   I shared that I would inform the team of the patients wishes.   Plan for [facility] with [facility].   Questions and concerns addressed/Palliative Support Provided.   Add charge. 30  Objective Assessment: Vital Signs Vitals:   03/02/24 2041 03/03/24 0804  BP: (!) 199/69 93/73  Pulse: 67 75  Resp: 18 17  Temp: 98.7 F (37.1 C) 98.3 F (36.8 C)  SpO2: 97% 94%    Intake/Output Summary (Last 24 hours) at 03/03/2024 9078 Last data filed at 03/03/2024 0348 Gross per 24 hour  Intake --  Output 550 ml  Net -550 ml   Last Weight  Most recent update: 02/29/2024  2:37 PM    Weight  80.9 kg (178 lb 5.6 oz)            Gen: Elderly Caucasian female HEENT: moist mucous membranes CV: Regular rate and rhythm PULM: On room air breathing is even and nonlabored ABD: soft/nontender EXT: No edema Neuro: Somnolent  SUMMARY OF RECOMMENDATIONS   DNAR/DNI   Patients daughter would like to review her mothers living will this morning and move  forward with decisions thereafter  [facility] is involved   Ongoing palliative care support ______________________________________________________________________________________ [name] [facility] Palliative Medicine Team Team Cell Phone: [phone] Please utilize secure chat with additional questions, if there is no response within 30 minutes please call the above phone number  Time: 10  Palliative Medicine Team providers are available by phone from 7am to 7pm daily and  can be reached through the team cell phone.  Should this patient require assistance outside of these hours, please call the patient's attending physician.

## 2024-03-04 DIAGNOSIS — Z515 Encounter for palliative care: Secondary | ICD-10-CM | POA: Diagnosis not present

## 2024-03-04 NOTE — Care Management Important Message (Signed)
 Important Message  Patient Details  Name: Kaitlyn Garrison MRN: [id number] Date of Birth: Oct 31, 1924   Important Message Given:  Yes - Medicare IM     Claretta Deed 03/04/2024, 8:04 AM

## 2024-03-04 NOTE — Plan of Care (Signed)
 Problem: Education: Goal: Knowledge of disease or condition will improve 03/04/2024 1017 by Rennie Laneta BROCKS, RN Outcome: Progressing 03/04/2024 1016 by Rennie Laneta BROCKS, RN Outcome: Progressing Goal: Knowledge of secondary prevention will improve (MUST DOCUMENT ALL) 03/04/2024 1017 by Rennie Laneta BROCKS, RN Outcome: Progressing 03/04/2024 1016 by Rennie Laneta BROCKS, RN Outcome: Progressing Goal: Knowledge of patient specific risk factors will improve (DELETE if not current risk factor) 03/04/2024 1017 by Rennie Laneta BROCKS, RN Outcome: Progressing 03/04/2024 1016 by Rennie Laneta BROCKS, RN Outcome: Progressing   Problem: Ischemic Stroke/TIA Tissue Perfusion: Goal: Complications of ischemic stroke/TIA will be minimized 03/04/2024 1017 by Rennie Laneta BROCKS, RN Outcome: Progressing 03/04/2024 1016 by Rennie Laneta BROCKS, RN Outcome: Progressing   Problem: Coping: Goal: Will verbalize positive feelings about self 03/04/2024 1017 by Rennie Laneta BROCKS, RN Outcome: Progressing 03/04/2024 1016 by Rennie Laneta BROCKS, RN Outcome: Progressing Goal: Will identify appropriate support needs 03/04/2024 1017 by Rennie Laneta BROCKS, RN Outcome: Progressing 03/04/2024 1016 by Rennie Laneta BROCKS, RN Outcome: Progressing   Problem: Health Behavior/Discharge Planning: Goal: Ability to manage health-related needs will improve 03/04/2024 1017 by Rennie Laneta BROCKS, RN Outcome: Progressing 03/04/2024 1016 by Rennie Laneta BROCKS, RN Outcome: Progressing Goal: Goals will be collaboratively established with patient/family 03/04/2024 1017 by Rennie Laneta BROCKS, RN Outcome: Progressing 03/04/2024 1016 by Rennie Laneta BROCKS, RN Outcome: Progressing   Problem: Self-Care: Goal: Ability to participate in self-care as condition permits will improve 03/04/2024 1017 by Rennie Laneta BROCKS, RN Outcome: Progressing 03/04/2024 1016 by Rennie Laneta BROCKS, RN Outcome: Progressing Goal: Verbalization of feelings and concerns over difficulty with  self-care will improve 03/04/2024 1017 by Rennie Laneta BROCKS, RN Outcome: Progressing 03/04/2024 1016 by Rennie Laneta BROCKS, RN Outcome: Progressing Goal: Ability to communicate needs accurately will improve 03/04/2024 1017 by Rennie Laneta BROCKS, RN Outcome: Progressing 03/04/2024 1016 by Rennie Laneta BROCKS, RN Outcome: Progressing   Problem: Nutrition: Goal: Risk of aspiration will decrease 03/04/2024 1017 by Rennie Laneta BROCKS, RN Outcome: Progressing 03/04/2024 1016 by Rennie Laneta BROCKS, RN Outcome: Progressing Goal: Dietary intake will improve 03/04/2024 1017 by Rennie Laneta BROCKS, RN Outcome: Progressing 03/04/2024 1016 by Rennie Laneta BROCKS, RN Outcome: Progressing   Problem: Education: Goal: Knowledge of General Education information will improve Description: Including pain rating scale, medication(s)/side effects and non-pharmacologic comfort measures 03/04/2024 1017 by Rennie Laneta BROCKS, RN Outcome: Progressing 03/04/2024 1016 by Rennie Laneta BROCKS, RN Outcome: Progressing   Problem: Health Behavior/Discharge Planning: Goal: Ability to manage health-related needs will improve 03/04/2024 1017 by Rennie Laneta BROCKS, RN Outcome: Progressing 03/04/2024 1016 by Rennie Laneta BROCKS, RN Outcome: Progressing   Problem: Clinical Measurements: Goal: Ability to maintain clinical measurements within normal limits will improve 03/04/2024 1017 by Rennie Laneta BROCKS, RN Outcome: Progressing 03/04/2024 1016 by Rennie Laneta BROCKS, RN Outcome: Progressing Goal: Will remain free from infection 03/04/2024 1017 by Rennie Laneta BROCKS, RN Outcome: Progressing 03/04/2024 1016 by Rennie Laneta BROCKS, RN Outcome: Progressing Goal: Diagnostic test results will improve 03/04/2024 1017 by Rennie Laneta BROCKS, RN Outcome: Progressing 03/04/2024 1016 by Rennie Laneta BROCKS, RN Outcome: Progressing Goal: Respiratory complications will improve 03/04/2024 1017 by Rennie Laneta BROCKS, RN Outcome: Progressing 03/04/2024 1016 by Rennie Laneta BROCKS,  RN Outcome: Progressing Goal: Cardiovascular complication will be avoided 03/04/2024 1017 by Rennie Laneta BROCKS, RN Outcome: Progressing 03/04/2024 1016 by Rennie Laneta BROCKS, RN Outcome: Progressing   Problem: Activity: Goal: Risk for activity intolerance will decrease 03/04/2024 1017 by Rennie Laneta BROCKS, RN Outcome: Progressing 03/04/2024 1016 by  Rennie Laneta BROCKS, RN Outcome: Progressing   Problem: Nutrition: Goal: Adequate nutrition will be maintained 03/04/2024 1017 by Rennie Laneta BROCKS, RN Outcome: Progressing 03/04/2024 1016 by Rennie Laneta BROCKS, RN Outcome: Progressing   Problem: Coping: Goal: Level of anxiety will decrease 03/04/2024 1017 by Rennie Laneta BROCKS, RN Outcome: Progressing 03/04/2024 1016 by Rennie Laneta BROCKS, RN Outcome: Progressing   Problem: Elimination: Goal: Will not experience complications related to bowel motility 03/04/2024 1017 by Rennie Laneta BROCKS, RN Outcome: Progressing 03/04/2024 1016 by Rennie Laneta BROCKS, RN Outcome: Progressing Goal: Will not experience complications related to urinary retention 03/04/2024 1017 by Rennie Laneta BROCKS, RN Outcome: Progressing 03/04/2024 1016 by Rennie Laneta BROCKS, RN Outcome: Progressing   Problem: Pain Managment: Goal: General experience of comfort will improve and/or be controlled 03/04/2024 1017 by Rennie Laneta BROCKS, RN Outcome: Progressing 03/04/2024 1016 by Rennie Laneta BROCKS, RN Outcome: Progressing   Problem: Safety: Goal: Ability to remain free from injury will improve 03/04/2024 1017 by Rennie Laneta BROCKS, RN Outcome: Progressing 03/04/2024 1016 by Rennie Laneta BROCKS, RN Outcome: Progressing   Problem: Skin Integrity: Goal: Risk for impaired skin integrity will decrease 03/04/2024 1017 by Rennie Laneta BROCKS, RN Outcome: Progressing 03/04/2024 1016 by Rennie Laneta BROCKS, RN Outcome: Progressing   Problem: Education: Goal: Ability to describe self-care measures that may prevent or decrease complications (Diabetes Survival  Skills Education) will improve 03/04/2024 1017 by Rennie Laneta BROCKS, RN Outcome: Progressing 03/04/2024 1016 by Rennie Laneta BROCKS, RN Outcome: Progressing Goal: Individualized Educational Video(s) 03/04/2024 1017 by Rennie Laneta BROCKS, RN Outcome: Progressing 03/04/2024 1016 by Rennie Laneta BROCKS, RN Outcome: Progressing   Problem: Coping: Goal: Ability to adjust to condition or change in health will improve 03/04/2024 1017 by Rennie Laneta BROCKS, RN Outcome: Progressing 03/04/2024 1016 by Rennie Laneta BROCKS, RN Outcome: Progressing   Problem: Fluid Volume: Goal: Ability to maintain a balanced intake and output will improve 03/04/2024 1017 by Rennie Laneta BROCKS, RN Outcome: Progressing 03/04/2024 1016 by Rennie Laneta BROCKS, RN Outcome: Progressing   Problem: Health Behavior/Discharge Planning: Goal: Ability to identify and utilize available resources and services will improve 03/04/2024 1017 by Rennie Laneta BROCKS, RN Outcome: Progressing 03/04/2024 1016 by Rennie Laneta BROCKS, RN Outcome: Progressing Goal: Ability to manage health-related needs will improve 03/04/2024 1017 by Rennie Laneta BROCKS, RN Outcome: Progressing 03/04/2024 1016 by Rennie Laneta BROCKS, RN Outcome: Progressing   Problem: Metabolic: Goal: Ability to maintain appropriate glucose levels will improve 03/04/2024 1017 by Rennie Laneta BROCKS, RN Outcome: Progressing 03/04/2024 1016 by Rennie Laneta BROCKS, RN Outcome: Progressing   Problem: Nutritional: Goal: Maintenance of adequate nutrition will improve 03/04/2024 1017 by Rennie Laneta BROCKS, RN Outcome: Progressing 03/04/2024 1016 by Rennie Laneta BROCKS, RN Outcome: Progressing Goal: Progress toward achieving an optimal weight will improve 03/04/2024 1017 by Rennie Laneta BROCKS, RN Outcome: Progressing 03/04/2024 1016 by Rennie Laneta BROCKS, RN Outcome: Progressing   Problem: Skin Integrity: Goal: Risk for impaired skin integrity will decrease 03/04/2024 1017 by Rennie Laneta BROCKS, RN Outcome:  Progressing 03/04/2024 1016 by Rennie Laneta BROCKS, RN Outcome: Progressing   Problem: Tissue Perfusion: Goal: Adequacy of tissue perfusion will improve 03/04/2024 1017 by Rennie Laneta BROCKS, RN Outcome: Progressing 03/04/2024 1016 by Rennie Laneta BROCKS, RN Outcome: Progressing

## 2024-03-04 NOTE — Discharge Summary (Signed)
 Physician Discharge Summary   Patient: Kaitlyn Garrison MRN: [id number] DOB: 24-Feb-1925  Admit date:     02/29/2024  Discharge date: 03/04/24  Discharge Physician: Drue ONEIDA Potter   PCP: Onita Rush, MD   Recommendations at discharge:  Follow-up with home hospice  Discharge Diagnoses: Principal Problem:   CVA (cerebral vascular accident) (HCC) Active Problems:   HTN (hypertension)   Type II diabetes mellitus (HCC)   CKD (chronic kidney disease) stage 3, GFR 30-59 ml/min (HCC)   Hyperlipidemia  Resolved Problems:   AKI (acute kidney injury) [facility])  Hospital Course:  89 y.o. year old female with past medical history of vertigo, HTN, type 2 diabetes mellitus, chronic kidney disease, hyperlipidemia, osteoarthritis, giant cell arteritis, iron deficiency anemia, and Gout.  She was brought to [facility] ED after being found not speaking and not moving her right side by her family. CT scan head showed large acute Left MCA infarct with brain edema. Neurology was consulted. She was out of TNK window, Needs hospice. Palliative on board and patient and family have agreed for discharge home with hospice.  They also agreed that since patient is unable to swallow for her current medication to be discontinued.   Other hospital course as outlined below:  Acute ischemic cerebrovascular accident involving left MCA leading to brain edema, swelling and midline shift,POA: CT head with a left MCA infarct with extensive cytotoxic edema and small petechial hemorrhage.  CTA Head/Neck with high-grade stenosis of the proximal left M1 and occlusion of the inferior and anterior left M2 branches with minimal collaterals -MRI Brain showed Large evolving acute left MCA distribution infarct. Associated gyral swelling and edema with trace 2 mm left-to-right shift. Associated prominent petechial hemorrhage, most pronounced at the left temporal lobe. No frank intraparenchymal hematoma formation by MRI, Smaller acute  contralateral right frontal lobe infarct. No associated hemorrhage or mass effect, Underlying age-related cerebral atrophy with mild chronic patient remains obtunded Family have opted for discharge home with hospice   Chronic medical issues:   Hypertension - continue to monitor     Chronic kidney disease stage IIIA - Avoid nephrotoxins, contrast Dyes, Hypotension   Hyperlipidemia Can't take oral meds because of being obtunded   Type 2 diabetes mellitus Diet controlled    Consultants: Hospice and palliative Procedures performed: None Disposition: Hospice care Diet recommendation:  Hospice care DISCHARGE MEDICATION: Allergies as of 03/04/2024       Reactions   Codeine Nausea And Vomiting   Ms Contin  [morphine ] Nausea And Vomiting        Medication List     STOP taking these medications    acetaminophen  500 MG tablet Commonly known as: TYLENOL    atenolol  50 MG tablet Commonly known as: TENORMIN    triamterene -hydrochlorothiazide  37.5-25 MG tablet Commonly known as: MAXZIDE -25        Discharge Exam: Filed Weights   02/29/24 0957 02/29/24 1258  Weight: 82.8 kg 80.9 kg   General exam: Obtunded with shallow breathing, unable to communicate Respiratory system: Shallow, slow breathing effort Cardiovascular system: S1 & S2 heard, RRR. No JVD, murmurs, rubs, gallops or clicks. No pedal edema. Gastrointestinal system: No abnormalities noted Central nervous system: Obtunded, unable to assess motor or sensory function Extremities: Obtunded Skin: No rashes, lesions or ulcers  Condition at discharge: fair  The results of significant diagnostics from this hospitalization (including imaging, microbiology, ancillary and laboratory) are listed below for reference.   Imaging Studies: MR BRAIN WO CONTRAST Result Date: 02/29/2024 CLINICAL DATA:  Follow-up examination for stroke. EXAM: MRI HEAD WITHOUT CONTRAST TECHNIQUE: Multiplanar, multiecho pulse sequences of the brain  and surrounding structures were obtained without intravenous contrast. COMPARISON:  Comparison made with prior CTs from earlier the same day. FINDINGS: Brain: Generalized age-related cerebral atrophy. Mild chronic microvascular ischemic disease for age. Patchy and confluent restricted diffusion involving the left cerebral hemisphere, consistent with a large evolving acute left MCA distribution infarct. There is involvement of the left frontal, parietal, temporal, and occipital lobes, with mild patchy involvement of the underlying left basal ganglia. Associated gyral swelling and edema with trace 2 mm left-to-right shift at the septum pellucidum. Associated prominent petechial hemorrhage, most pronounced at the left temporal lobe (series 12, image 27). No frank intraparenchymal hematoma formation by MRI. Smaller acute infarct noted involving the contralateral right frontal lobe (series 5, image 87). No associated hemorrhage or mass effect. No other evidence for acute or subacute ischemia. No other areas of chronic cortical infarction. No other acute or chronic intracranial blood products. No mass lesion. Ventricles normal size without hydrocephalus. No extra-axial fluid collection. Pituitary gland and suprasellar region within normal limits. Vascular: Major intracranial vascular flow voids are maintained. Skull and upper cervical spine: Craniocervical junction within normal limits. Bone marrow signal intensity normal. No scalp soft tissue abnormality. Sinuses/Orbits: Prior bilateral ocular lens replacement. Chronic right maxillary sinus disease noted. Paranasal sinuses are otherwise largely clear. No significant mastoid effusion. Other: None. IMPRESSION: 1. Large evolving acute left MCA distribution infarct. Associated gyral swelling and edema with trace 2 mm left-to-right shift. Associated prominent petechial hemorrhage, most pronounced at the left temporal lobe. No frank intraparenchymal hematoma formation by MRI.  2. Smaller acute contralateral right frontal lobe infarct. No associated hemorrhage or mass effect. 3. Underlying age-related cerebral atrophy with mild chronic small vessel ischemic disease. Electronically Signed   By: Morene Hoard M.D.   On: 02/29/2024 20:29   CT ANGIO HEAD NECK W WO CM (CODE STROKE) Result Date: 02/29/2024 EXAM: CT HEAD WITHOUT CTA HEAD AND NECK WITH AND WITHOUT 02/29/2024 10:07:02 AM TECHNIQUE: CTA of the head and neck was performed with and without the administration of intravenous contrast. Noncontrast CT of the head with reconstructed 2-D images are also provided for review. Multiplanar 2D and/or 3D reformatted images are provided for review. Automated exposure control, iterative reconstruction, and/or weight based adjustment of the mA/kV was utilized to reduce the radiation dose to as low as reasonably achievable. COMPARISON: CT head without contrast 02/29/2024 CLINICAL HISTORY: Neuro deficit, acute, stroke suspected; R hemiplegia and neglect R gaze preference concern for L MCA occlusion. Code Stroke; Matthews [phone] FINDINGS: CT HEAD: BRAIN AND VENTRICLES: Left MCA territory infarct is again noted. ORBITS: No acute abnormality. SINUSES AND MASTOIDS: No acute abnormality. CTA NECK: AORTIC ARCH AND ARCH VESSELS: Extensive atherosclerotic changes are present at the aortic arch. The great vessels are patent. Carotid changes extend into the proximal left subclavian artery without significant stenosis. CERVICAL CAROTID ARTERIES: Atherosclerotic calcifications are present in the right carotid bifurcation and proximal right ICA without significant stenosis. Moderate tortuosity of the cervical right ICA is present without significant stenosis relative to the more distal vessel. Atherosclerotic calcifications are present in the left carotid bifurcation and proximal left ICA without significant stenosis relative to the more distal vessel. CERVICAL VERTEBRAL ARTERIES: The left vertebral  artery is dominant. A high-grade stenosis is present in the proximal right vertebral artery. No distal stenoses are present in either vertebral artery in the neck. LUNGS AND MEDIASTINUM: Unremarkable. SOFT  TISSUES: Multinodular thyroid  present. The largest nodule is at the lower pole of the right lobe of the thyroid  measuring 20 mm. BONES: Multilevel degenerative changes are present in the cervical spine with ankylosis across the disc space C5-6 and C6-7. Slight degenerative anterolisthesis is present at C2-3 and C3-4. CTA HEAD: ANTERIOR CIRCULATION: Atherosclerotic changes are present within the cavernous internal carotid arteries bilaterally without significant stenoses. High-grade stenosis is present in the proximal left M1 segment. The inferior and anterior left M2 branches are occluded. Minimal collateral vessels are present. Moderate narrowing is present in the distal right P2 segment. POSTERIOR CIRCULATION: No significant stenosis of the posterior cerebral arteries. No significant stenosis of the basilar artery. No significant stenosis of the vertebral arteries. No aneurysm. OTHER: No dural venous sinus thrombosis on this non-dedicated study. IMPRESSION: 1. Left MCA territory infarct. 2. High-grade stenosis in the proximal left M1 segment 3. Occlusion of the inferior and anterior left M2 branches. Minimal collateral vessels are present. 4. High-grade stenosis in the proximal right vertebral artery. The findings were communicated to Dr. Matthews via the Amion system at 10:28 am. Electronically signed by: Lonni Necessary MD 02/29/2024 10:28 AM EDT RP Workstation: [id number]   CT HEAD CODE STROKE WO CONTRAST Result Date: 02/29/2024 CLINICAL DATA:  Code stroke.  89 year old female. EXAM: CT HEAD WITHOUT CONTRAST TECHNIQUE: Contiguous axial images were obtained from the base of the skull through the vertex without intravenous contrast. RADIATION DOSE REDUCTION: This exam was performed according to the  departmental dose-optimization program which includes automated exposure control, adjustment of the mA and/or kV according to patient size and/or use of iterative reconstruction technique. COMPARISON:  Brain MRI 08/18/2003.  Head CT 08/17/2003. FINDINGS: Brain: Cytotoxic edema left hemisphere. Left MCA territory. ASPECTS 4. Associated petechial hemorrhage posterior left temporal lobe (coronal image 27). No malignant hemorrhagic transformation. Mild mass effect on the left lateral ventricle. No midline shift. Basilar cisterns remain patent. Contralateral right hemisphere and posterior fossa gray-white differentiation maintained. No other acute intracranial hemorrhage. No ventriculomegaly. Vascular: Hyperdense left MCA branch suspected distal M1 region coronal image 39. Calcified atherosclerosis at the skull base. Skull: Intact.  No acute osseous abnormality identified. Sinuses/Orbits: New but chronic right maxillary sinus mucoperiosteal thickening since 2005. Other Visualized paranasal sinuses and mastoids are stable and well aerated. Other: No convincing gaze deviation, acute orbit or scalp soft tissue finding. ASPECTS [facility] Stroke Program Early CT Score) - Ganglionic level infarction (caudate, lentiform nuclei, internal capsule, insula, M1-M3 cortex): 3 - Supraganglionic infarction (M4-M6 cortex): 1 Total score (0-10 with 10 being normal): 4 IMPRESSION: 1. Recent Left MCA infarct with fairly extensive cytotoxic edema and small petechial hemorrhage. ASPECTS 4. Hyperdense Left MCA distal M1 suspected. 2. No malignant hemorrhagic transformation. No significant intracranial mass effect at this time. These results were communicated to Dr. Matthews at 10:05 am on 02/29/2024 by text page via the [facility] messaging system. Electronically Signed   By: VEAR Hurst M.D.   On: 02/29/2024 10:06    Microbiology: No results found for this or any previous visit.  Labs: CBC: Recent Labs  Lab 02/29/24 0955 02/29/24 1001  WBC  15.5*  --   NEUTROABS 13.9*  --   HGB 13.3 14.3  HCT 42.1 42.0  MCV 88.3  --   PLT 256  --    Basic Metabolic Panel: Recent Labs  Lab 02/29/24 0955 02/29/24 1001  NA 140 141  K 4.1 4.2  CL 105 108  CO2 22  --   GLUCOSE  275* 276*  BUN 47* 50*  CREATININE 1.37* 1.20*  CALCIUM 9.7  --    Liver Function Tests: Recent Labs  Lab 02/29/24 0955  AST 27  ALT 19  ALKPHOS 72  BILITOT 0.5  PROT 7.7  ALBUMIN 3.9   CBG: Recent Labs  Lab 02/29/24 1550 02/29/24 2008 03/01/24 0002 03/01/24 0400 03/01/24 0738  GLUCAP 175* 134* 121* 113* 118*    Discharge time spent:  34 minutes.  Signed: Drue ONEIDA Potter, MD [facility] 03/04/2024

## 2024-03-04 NOTE — TOC Transition Note (Signed)
 Transition of Care [facility]) - Discharge Note   Patient Details  Name: Mitzie Helen Winterhalter MRN: [id number] Date of Birth: 1925-04-06  Transition of [facility]) CM/SW Contact:  Andrez JULIANNA George, RN Phone Number: 03/04/2024, 12:58 PM   Clinical Narrative:     Pt is discharging home with hospice services [facility] Pt will transport home via [facility]. Family at bedside updated and bedside RN aware. Home address verified with family.   Final next level of care: Home w Hospice Care Barriers to Discharge: No Barriers Identified   Patient Goals and CMS Choice Patient states their goals for this hospitalization and ongoing recovery are:: go home CMS Medicare.gov Compare Post Acute Care list provided to:: Patient Represenative (must comment) Choice offered to / list presented to : Adult Children      Discharge Placement                       Discharge Plan and Services Additional resources added to the After Visit Summary for     Discharge Planning Services: CM Consult Post Acute Care Choice: Hospice, Durable Medical Equipment                      [facility] Agency: [facility] Agency Contacted: 03/01/24 Time HH Agency Contacted: 1123 Representative spoke with at [facility] Agency: Matilda  Social Drivers of Health (SDOH) Interventions SDOH Screenings   Housing: Patient Unable To Answer (02/29/2024)  Utilities: Patient Unable To Answer (02/29/2024)  Tobacco Use: Low Risk  (08/12/2023)     Readmission Risk Interventions     No data to display

## 2024-03-04 NOTE — Plan of Care (Deleted)

## 2024-03-04 NOTE — Progress Notes (Signed)
   Palliative Medicine Inpatient Follow Up Note HPI: Kaitlyn Garrison is a 89 y.o. year old female with past medical history of vertigo, HTN, type 2 diabetes mellitus, chronic kidney disease, hyperlipidemia, osteoarthritis, giant cell arteritis, iron deficiency anemia, and Gout.  She presents to [facility] ED after being found not speaking and not moving her right side by her family.  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 Palliative care has been asked to support additional goals of care conversations.   Today's Discussion 03/04/2024  *Please note that this is a verbal dictation therefore any spelling or grammatical errors are due to the Dragon Medical One system interpretation.  Chart reviewed inclusive of vital signs, progress notes, laboratory results, and diagnostic images.   Kaitlyn Garrison is lying in bed this morning resting comfortably in NAD.   Patients daughter, [name] and granddaughter are present at bedside. We had a long discussion of patients clinical status now and the person she was prior to this. Patient family reflected on the beautiful life that she has lived. They share that she is generally very much up for anything, travel and fun were her great joys in life.   Discussed the plan for home with hospice today. Patients family share that they are looking forward to her being in her comfortable environment.  Questions and concerns addressed/Palliative Support Provided.    Objective Assessment: Vital Signs Vitals:   03/04/24 0129 03/04/24 0759  BP:  (!) 176/59  Pulse:  71  Resp:  16  Temp: 98.2 F (36.8 C) 98.7 F (37.1 C)  SpO2:  92%    Intake/Output Summary (Last 24 hours) at 03/04/2024 1156 Last data filed at 03/04/2024 0900 Gross per 24 hour  Intake 0 ml  Output 250 ml  Net -250 ml   Last Weight  Most recent update: 02/29/2024  2:37 PM    Weight  80.9 kg (178 lb 5.6 oz)            Gen: Elderly  Caucasian female HEENT: moist mucous membranes CV: Regular rate and rhythm PULM: On room air breathing is even and nonlabored ABD: soft/nontender EXT: No edema Neuro: Somnolent  SUMMARY OF RECOMMENDATIONS   DNAR/DNI   Gold DNR on chart  [facility] ______________________________________________________________________________________ Kaitlyn Garrison [place] Palliative Medicine Team Team Cell Phone: [phone] Please utilize secure chat with additional questions, if there is no response within 30 minutes please call the above phone number  Time: 21  Palliative Medicine Team providers are available by phone from 7am to 7pm daily and can be reached through the team cell phone.  Should this patient require assistance outside of these hours, please call the patient's attending physician.

## 2024-04-01 DEATH — deceased
